# Patient Record
Sex: Male | Born: 1975 | Race: White | Hispanic: No | Marital: Married | Smoking: Never smoker
Health system: Southern US, Community
[De-identification: ages and names within clinical notes are randomized; demographics above are authoritative.]

## PROBLEM LIST (undated history)

## (undated) DIAGNOSIS — N200 Calculus of kidney: Secondary | ICD-10-CM

---

## 2011-07-02 ENCOUNTER — Ambulatory Visit (INDEPENDENT_AMBULATORY_CARE_PROVIDER_SITE_OTHER): Payer: No Typology Code available for payment source | Admitting: PREVENTIVE MEDICINE-OCCUPATIONAL MEDICINE

## 2011-07-22 NOTE — Patient Instructions (Signed)
Vaccine Information Statement    Inactivated Influenza Vaccine: What You Need to Know  2012 - 2013    Many Vaccine Information Statements are available in Spanish and other languages. See www.immunize.org/vis  Hojas de Informacin Sobre Vacunas estn disponibles en espaol y en muchos otros idiomas. Visite www.immunize.org/vis      1. Why get vaccinated?    Influenza ("flu") is a contagious disease.     It is caused by the influenza virus, which can be spread by coughing, sneezing, or nasal secretions.     Anyone can get influenza, but rates of infection are highest among children. For most people, symptoms last only a few days. They include:  . fever/chills   . sore throat . fatigue  . cough   . headache . muscle aches  . runny or stuffy nose    Other illnesses can have the same symptoms and are often mistaken for influenza.    Young children, people 65 and older, pregnant women, and people with certain health conditions - such as heart, lung or kidney disease or a weakened immune system - can get much sicker. Flu can cause high fever and pneumonia, and make existing medical conditions worse. It can cause diarrhea and seizures in children. Each year thousands of people die from influenza and even more require hospitalization.     By getting flu vaccine you can protect yourself from influenza and may also avoid spreading influenza to others.      2. Inactivated influenza vaccine    There are two types of influenza vaccine:     1. Inactivated (killed) vaccine, the "flu shot," is given by injection with a needle.     2. Live, attenuated (weakened) influenza vaccine is sprayed into the nostrils. This vaccine is described in a separate Vaccine Information Statement.     A "high-dose" inactivated influenza vaccine is available for people 65 years of age and older. Ask your doctor for more information.    Influenza viruses are always changing, so annual vaccination is recommended. Each year scientists try to match the  viruses in the vaccine to those most likely to cause flu that year.  Flu vaccine will not prevent disease from other viruses, including flu viruses not contained in the vaccine.    It takes up to 2 weeks for protection to develop after the shot. Protection lasts about a year.     Some inactivated influenza vaccine contains a preservative called thimerosal. Thimerosal-free influenza vaccine is available. Ask your doctor for more information.      3. Who should get inactivated influenza vaccine and when?    WHO  All people 6 months of age and older should get flu vaccine.    Vaccination is especially important for people at higher risk of severe influenza and their close contacts, including healthcare personnel and close contacts of children younger than 6 months.    WHEN  Get the vaccine as soon as it is available. This should provide protection if the flu season comes early. You can get the vaccine as long as illness is occurring in your community.     Influenza can occur at any time, but most influenza occurs from October through May. In recent seasons, most infections have occurred in January and February. Getting vaccinated in December, or even later, will still be beneficial in most years.    Adults and older children need one dose of influenza vaccine each year. But some children younger than 9 years of   age need two doses to be protected. Ask your doctor.    Influenza vaccine may be given at the same time as other vaccines, including pneumococcal vaccine.      4. Some people should not get inactivated influenza vaccine or should wait.    . Tell your doctor if you have any severe (life-threatening) allergies, including a severe allergy to eggs. A severe allergy to any vaccine component may be a reason not to get the vaccine. Allergic reactions to influenza vaccine are rare.      . Tell your doctor if you ever had a severe reaction after a dose of influenza vaccine.     . Tell your doctor if you ever had  Guillain-Barr Syndrome (a severe paralytic illness, also called GBS). Your doctor will help you decide whether the vaccine is recommended for you.     . People who are moderately or severely ill should usually wait until they recover before getting flu vaccine. If you are ill, talk to your doctor about whether to reschedule the vaccination. People with a mild illness can usually get the vaccine.      5. What are the risks from inactivated influenza vaccine?    A vaccine, like any medicine, could possibly cause serious problems, such as severe allergic reactions. The risk of a vaccine causing serious harm, or death, is extremely small.     Serious problems from inactivated influenza vaccine are very rare. The viruses in inactivated influenza vaccine have been killed, so you cannot get influenza from the vaccine.     Mild problems can include:   . soreness, redness, or swelling where the shot was given   . hoarseness; sore, red or itchy eyes; cough  . fever     . aches  . headache . itching . fatigue  If these problems occur, they usually begin soon after the shot and last 1-2 days.     Moderate problems:  Young children who get inactivated flu vaccine and pneumococcal vaccine (PCV13) at the same time appear to be at increased risk for seizures caused by fever. Ask your doctor for more information.    Tell your doctor if a child who is getting flu vaccine has ever had a seizure.    Severe problems:  . Life-threatening allergic reactions from vaccines are very rare. If they do occur, it is usually within a few minutes to a few hours after the shot.     . In 1976, a type of inactivated influenza (swine flu) vaccine was associated with Guillain-Barr Syndrome (GBS). Since then, flu vaccines have not been clearly linked to GBS. However, if there is a risk of GBS from current flu vaccines, it would be no more than 1 or 2 cases per million people vaccinated. This is much lower than the risk of severe influenza, which can  be prevented by vaccination.    The safety of vaccines is always being monitored.  For more information, visit:   www.cdc.gov/vaccinesafety/Vaccine_Monitoring/Index.html and   www.cdc.gov/vaccinesafety/Activities/Activities_Index.html    One brand of inactivated flu vaccine, called Afluria, should not be given to children 8 years old or younger, except in special circumstances. A related vaccine was associated with fevers and fever-related seizures in young children in [place]. Your doctor can give you more information.                   6. What if there is a severe reaction?    What should I look for?    Any unusual condition, such as a high fever or unusual behavior. Signs of a severe allergic reaction can include difficulty breathing, hoarseness or wheezing, hives, paleness, weakness, a fast heart beat or dizziness.    What should I do?  . Call a doctor, or get the person to a doctor right away.  . Tell the doctor what happened, the date and time it happened, and when the vaccination was given.  . Ask your doctor, nurse, or health department to report the reaction by filing a Vaccine Adverse Event Reporting System (VAERS) form. Or you can file this report through the VAERS website at www.vaers.hhs.gov, or by calling 1-800-822-7967.     VAERS does not provide medical advice.      7. The National Vaccine Injury Compensation Program    The National Vaccine Injury Compensation Program (VICP) was created in 1986.    People who believe they may have been injured by a vaccine can learn about the program and about filing a claim by calling 1-800-338-2382, or visiting the VICP website at ww.hrsa.gov/vaccinecompensation.      8. How can I learn more?    . Ask your doctor. They can give you the vaccine package insert or suggest other sources of information.  . Call your local or state health department.  . Contact the Centers for Disease Control and Prevention (CDC):   - Call 1-800-232-4636 (1-800-CDC-INFO) or   - Visit  CDC's website at www.cdc.gov/flu      Vaccine Information Statement (Interim)  Inactivated Influenza Vaccine (03/23/2011)       42 U.S.C. 300aa-26    Department of Health and Human Services  Centers for Disease Control and Prevention

## 2011-07-22 NOTE — Progress Notes (Signed)
Influenza vaccine given.  Merri Ray 07/22/2011, 9:51 AM

## 2012-08-04 ENCOUNTER — Ambulatory Visit (INDEPENDENT_AMBULATORY_CARE_PROVIDER_SITE_OTHER): Payer: No Typology Code available for payment source | Admitting: PREVENTIVE MEDICINE-OCCUPATIONAL MEDICINE

## 2012-08-04 NOTE — Progress Notes (Signed)
Influenza vaccine given.  Merri Ray 08/04/2012, 1:41 PM

## 2012-08-04 NOTE — Patient Instructions (Signed)
Vaccine Information Statement    Influenza (Flu) Vaccine (Inactivated): What you need to know  2013-14    Many Vaccine Information Statements are available in Spanish and other languages. See www.immunize.org/vis  Hojas de Informacián Sobre Vacunas están disponibles en Español y en muchos otros idiomas. Visite http://www.immunize.org/vis    1. Why get vaccinated?    Influenza (“flu”) is a contagious disease that spreads around the [place] every winter, usually between October and May.     Flu is caused by the influenza virus, and can be spread by coughing, sneezing, and close contact.     Anyone can get flu, but the risk of getting flu is highest among children. Symptoms come on suddenly and may last several days. They can include:  • fever/chills  • sore throat  • muscle aches  • fatigue  • cough  • headache   • runny or stuffy nose    Flu can make some people much sicker than others. These people include young children, people 65 and older, pregnant women, and people with certain health conditions - such as heart, lung or kidney disease, or a weakened immune system.  Flu vaccine is especially important for these people, and anyone in close contact with them.    Flu can also lead to pneumonia, and make existing medical conditions worse. It can cause diarrhea and seizures in children.     Each year thousands of people in the [place] die from flu, and many more are hospitalized.     Flu vaccine is the best protection we have from flu and its complications. Flu vaccine also helps prevent spreading flu from person to person.       2. Inactivated flu vaccine    There are two types of influenza vaccine:     You are getting an inactivated flu vaccine, which does not contain any live influenza virus. It is given by injection with a needle, and often called the “flu shot.”    A different, live, attenuated (weakened) influenza vaccine is sprayed into the nostrils. This vaccine is described in a separate Vaccine  Information Statement.    Flu vaccine is recommended every year. Children 6 months through 8 years of age should get two doses the first year they get vaccinated.    Flu viruses are always changing. Each year’s flu vaccine is made to protect from viruses that are most likely to cause disease that year. While flu vaccine cannot prevent all cases of flu, it is our best defense against the disease. Inactivated flu vaccine protects against 3 or 4 different influenza viruses.    It takes about 2 weeks for protection to develop after the vaccination, and protection lasts several months to a year.     Some illnesses that are not caused by influenza virus are often mistaken for flu. Flu vaccine will not prevent these illnesses. It can only prevent influenza.    A “high-dose” flu vaccine is available for people 65 years of age and older. The person giving you the vaccine can tell you more about it.    Some inactivated flu vaccine contains a very small amount of a mercury-based preservative called thimerosal. Studies have shown that thimerosal in vaccines is not harmful, but flu vaccines that do not contain a preservative are available.      3. Some people should not get this vaccine    Tell the person who gives you the vaccine:  • If you have any severe (  life-threatening) allergies.  If you ever had a life-threatening allergic reaction after a dose of flu vaccine, or have a severe allergy to any part of this vaccine, you may be advised not to get a dose.  Most, but not all, types of flu vaccine contain a small amount of egg.     • If you ever had Guillain-Barré Syndrome (a severe paralyzing illness, also called GBS). Some people with a history of GBS should not get this vaccine. This should be discussed with your doctor.  • If you are not feeling well.  They might suggest waiting until you feel better.  But you should come back.      4. Risks of a vaccine reaction    With a vaccine, like any medicine, there is a chance of  side effects. These are usually mild and go away on their own.     Serious side effects are also possible, but are very rare. Inactivated flu vaccine does not contain live flu virus, so getting flu from this vaccine is not possible.     Brief fainting spells and related symptoms (such as jerking movements) can happen after any medical procedure, including vaccination. Sitting or lying down for about 15 minutes after a vaccination can help prevent fainting and injuries caused by falls. Tell your doctor if you feel dizzy or light-headed, or have vision changes or ringing in the ears.    Mild problems following inactivated flu vaccine:   • soreness, redness, or swelling where the shot was given    • hoarseness; sore, red or itchy eyes; cough  • fever  • aches  • headache  • itching  • fatigue  If these problems occur, they usually begin soon after the shot and last 1 or 2 days.     Moderate problems following inactivated flu vaccine:  • Young children who get inactivated flu vaccine and pneumococcal vaccine (PCV13) at the same time may be at increased risk for seizures caused by fever. Ask your doctor for more information. Tell your doctor if a child who is getting flu vaccine has ever had a seizure.    Severe problems following inactivated flu vaccine:  • A severe allergic reaction could occur after any vaccine (estimated less than 1 in a million doses).   • There is a small possibility that inactivated flu vaccine could be associated with Guillain-Barré Syndrome (GBS), no more than 1 or 2 cases per million people vaccinated. This is much lower than the risk of severe complications from flu, which can be prevented by flu vaccine.     The safety of vaccines is always being monitored.  For more information, visit: www.cdc.gov/vaccinesafety/       5. What if there is a serious reaction?    What should I look for?    • Look for anything that concerns you, such as signs of a severe allergic reaction, very high fever, or  behavior changes.    Signs of a severe allergic reaction can include hives, swelling of the face and throat, difficulty breathing, a fast heartbeat, dizziness, and weakness. These would start a few minutes to a few hours after the vaccination.    What should I do?    • If you think it is a severe allergic reaction or other emergency that can’t wait, call 9-1-1 or get the person to the nearest hospital. Otherwise, call your doctor.  • Afterward, the reaction should be reported to the Vaccine Adverse Event   Reporting System (VAERS). Your doctor might file this report, or you can do it yourself through the VAERS web site at www.vaers.hhs.gov, or by calling 1-800-822-7967.    VAERS is only for reporting reactions. They do not give medical advice.      7. The National Vaccine Injury Compensation Program    The National Vaccine Injury Compensation Program (VICP) is a federal program that was created to compensate people who may have been injured by certain vaccines.    Persons who believe they may have been injured by a vaccine can learn about the program and about filing a claim by calling 1-800-338-2382 or visiting the VICP website at www.hrsa.gov/vaccinecompensation.      8. How can I learn more?  • Ask your doctor.  • Call your local or state health department.  • Contact the Centers for Disease Control and   Prevention (CDC):  - Call 1-800-232-4636 (1-800-CDC-INFO) or  - Visit CDC’s website at www.cdc.gov/flu      Vaccine Information Statement (Interim)  Inactivated Influenza Vaccine   (04/15/2012)   42 U.S.C. § 300aa-26    Department of Health and Human Services  Centers for Disease Control and Prevention    Office Use Only

## 2012-10-19 ENCOUNTER — Encounter (FREE_STANDING_LABORATORY_FACILITY): Admit: 2012-10-19 | Discharge: 2012-10-19 | Disposition: A | Discharge status: Home / Self Care | Payer: Self-pay

## 2012-10-19 LAB — COMPREHENSIVE METABOLIC PNL, FASTING
ALBUMIN: 4.3 g/dL (ref 3.5–4.8)
ALKALINE PHOSPHATASE: 48 U/L (ref 38–126)
ALT (SGPT): 33 U/L (ref 7–55)
ANION GAP: 4 mmol/L — ABNORMAL LOW (ref 5–16)
AST (SGOT): 21 U/L (ref 8–48)
BILIRUBIN, TOTAL: 0.7 mg/dL (ref 0.3–1.3)
BUN/CREAT RATIO: 11 (ref 6–22)
BUN: 10 mg/dL (ref 8–26)
CALCIUM: 9.7 mg/dL (ref 8.5–10.4)
CARBON DIOXIDE: 29 mmol/L (ref 23–33)
CHLORIDE: 106 mmol/L (ref 96–111)
CREATININE: 0.89 mg/dL (ref 0.62–1.27)
ESTIMATED GLOMERULAR FILTRATION RATE: >59 ml/min/1.73m2 (ref >59)
GLUCOSE, FASTING: 99 mg/dL (ref 70–105)
POTASSIUM: 4.9 mmol/L (ref 3.5–5.1)
SODIUM: 139 mmol/L (ref 136–145)
TOTAL PROTEIN: 7.5 g/dL (ref 6.4–8.3)

## 2012-10-19 LAB — LIPID PANEL
CHOLESTEROL: 241 mg/dL — ABNORMAL HIGH (ref <200)
HDL-CHOLESTEROL: 32 mg/dL — ABNORMAL LOW (ref >39)
LDL (CALCULATED): 160 mg/dL — ABNORMAL HIGH (ref <100)
NON - HDL (CALCULATED): 209 mg/dL — ABNORMAL HIGH (ref <190)
TRIGLYCERIDES: 244 mg/dL — ABNORMAL HIGH (ref <150)
VLDL (CALCULATED): 49 mg/dL — ABNORMAL HIGH (ref <30)

## 2012-12-12 ENCOUNTER — Encounter (INDEPENDENT_AMBULATORY_CARE_PROVIDER_SITE_OTHER): Payer: Self-pay | Admitting: Family Medicine

## 2012-12-12 ENCOUNTER — Ambulatory Visit: Payer: No Typology Code available for payment source | Attending: Family Medicine | Admitting: Family Medicine

## 2012-12-12 VITALS — BP 110/80 | HR 84 | Temp 97.4°F | Ht 68.43 in | Wt 193.1 lb

## 2012-12-12 DIAGNOSIS — Z3009 Encounter for other general counseling and advice on contraception: Secondary | ICD-10-CM | POA: Insufficient documentation

## 2012-12-12 NOTE — Progress Notes (Signed)
Subjective:     Patient ID:  Nathaniel Rowland is an 37 y.o. male   Chief Complaint:    Chief Complaint   Patient presents with   . Vasectomy Consult       HPI  He has been considering for 6 months to a year.  He has two children.  He reports wife is on board with decision.  No problems with local anesthetics.  No surgeries to scrotum.  No issues with Betadine.  No chronic medical conditions.  Non-smoker.  ROS  Objective:   Physical Exam   Constitutional: He is oriented to person, place, and time.   GU:  Vas deferens easily brought to surface bilaterally.  Neurological: He is alert and oriented to person, place, and time.   BP 110/80   Pulse 84   Temp(Src) 36.3 C (97.4 F) (Thermal Scan)   Ht 1.738 m (5' 8.43")   Wt 87.6 kg (193 lb 2 oz)   BMI 29 kg/m2   .    Assessment & Plan:       ICD-9-CM   1. Visit for vasectomy evaluation V25.09     Will schedule for vasectomy.  Went over procedure including risks/complications.  Pre-vas instructions given.    Dagoberto Ligas, DO  [facility]  Operated by [facility]  [address]  [place] [address]  Dept: [phone]  Dept Fax: [phone]

## 2012-12-13 ENCOUNTER — Encounter (INDEPENDENT_AMBULATORY_CARE_PROVIDER_SITE_OTHER): Payer: Self-pay

## 2012-12-14 ENCOUNTER — Encounter (INDEPENDENT_AMBULATORY_CARE_PROVIDER_SITE_OTHER): Payer: Self-pay

## 2013-02-10 ENCOUNTER — Ambulatory Visit
Admission: RE | Admit: 2013-02-10 | Discharge: 2013-02-10 | Disposition: A | Discharge status: Home / Self Care | Payer: No Typology Code available for payment source | Source: Ambulatory Visit | Attending: Family Medicine | Admitting: Family Medicine

## 2013-02-10 ENCOUNTER — Encounter (INDEPENDENT_AMBULATORY_CARE_PROVIDER_SITE_OTHER): Payer: Self-pay | Admitting: Family Medicine

## 2013-02-10 ENCOUNTER — Ambulatory Visit (INDEPENDENT_AMBULATORY_CARE_PROVIDER_SITE_OTHER): Payer: No Typology Code available for payment source | Admitting: Family Medicine

## 2013-02-10 ENCOUNTER — Other Ambulatory Visit (INDEPENDENT_AMBULATORY_CARE_PROVIDER_SITE_OTHER): Payer: Self-pay | Admitting: Family Medicine

## 2013-02-10 VITALS — BP 112/68 | HR 70 | Temp 97.4°F | Wt 192.7 lb

## 2013-02-10 DIAGNOSIS — Z302 Encounter for sterilization: Secondary | ICD-10-CM | POA: Insufficient documentation

## 2013-02-10 HISTORY — PX: HX VASECTOMY: SHX75

## 2013-02-10 NOTE — Procedures (Signed)
 Procedure:  Vasectomy  Indication:  Undesired fertility  Surgeons:  Dr. Selinda Rowland, Dr. Lupita Cyphers  Assistant: Jinnie Motts, MA      TIME OUT: A time out was performed at 1300 to confirm the correct patient, procedure, and site.Nathaniel Selinda CHRISTELLA Bad, DO        The patient was properly identified and the proper procedure confirmed.  This is a bilateral procedure.    Procedure:  After discussing the risks including regret, bleeding, infection, need for emergent intervention, lack of success of procedure, spermatoceles, and chronic testicular pain with Nathaniel Rowland, he wished to proceed.  The patient was placed in the supine position on the automated table.  The scrotum and regional area were prepped with Betadine and draped sterilely.      The left vas deferens was palpated and pushed directly under the scrotal skin, between the thumb and second and third digits.  2% Lidocaine without epinephrine was infiltrated subcutaneously.  A 15-blade scalpel was used to sharply incise the scrotal skin overlying the vas.  The vas dissecting forceps were used to bluntly separate connective tissue on either side of the vas.  The vas clamp was used to grasp the vas and bring it through the incision.  A second application of Bupivicaine was administered along the proximal and distal vas regions.      The vas dissecting forceps were used to dissect connective tissue overlying the vas.  Thermal cautery was used to dissect remaining connective tissue around the vas leaving a clean 2+ cm length of vas deferens.  The vas was hemitransected so electrocautery could be applied to both the proximal and distal stumps.  The intervening section was excised and placed into a specimen cup labeled left vas segment.  Hemostasis was ensured.  The scrotal skin was lifted up causing the return of internal tissues into the scrotum.  The scrotal incision was closed with clamp for 15 minutes.  Dressing with bacitracin applied.    Attention was  turned to the right side where a similar procedure was performed.  2% Lidocaine without epinephrine was infiltrated subcutaneously.  A 15-blade scalpel was used to sharply dissect the scrotal skin.  The vas dissecting clamps were used to bluntly dissect connective tissue on either side of the vas deferens.  The vas was grasped with the vas clamps and brought out of the incision.  A second administration of Bupivicaine was provided along the proximal and distal vas regions.  Vas dissecting forceps were used to dissect surrounding connective tissue.  Thermal cautery was used to dissect remaining connective around the vas leaving a clean 2+ cm segment of vas deferens.  The vas was hemitransected so electrocautery could be applied to both the proximal and distal stumps.  The intervening segment of vas was resected and placed in the specimen jar labeled right vas.  Hemostasis was ensured.  The scrotal skin was lifted up causing the return of internal tissues into the scrotum.  The scrotal incision was closed with clamp for 15 minutes.  Dressing with bacitracin applied.  The patient tolerated the procedure well with minimal discomfort.      Post-vasectomy instructions were reviewed including the notice of a lack of contraceptive effect until 6 weeks, 20 ejaculations, AND a semen analysis consistent with infertility.  He is to ice the area hourly this evening and tomorrow and participate in minimal if any movement over the weekend.  He will follow-up in three days on Monday for reexamination  and is to call sooner for any concerns.        Selinda CHRISTELLA Bad, DO 02/10/2013, 2:14 PM

## 2013-02-14 ENCOUNTER — Encounter (INDEPENDENT_AMBULATORY_CARE_PROVIDER_SITE_OTHER): Payer: Self-pay | Admitting: Family Medicine

## 2013-02-14 ENCOUNTER — Ambulatory Visit: Payer: No Typology Code available for payment source | Attending: Family Medicine | Admitting: Family Medicine

## 2013-02-14 VITALS — BP 112/78 | HR 68 | Temp 96.8°F | Ht 68.43 in | Wt 197.1 lb

## 2013-02-14 DIAGNOSIS — Z9852 Vasectomy status: Secondary | ICD-10-CM | POA: Insufficient documentation

## 2013-02-15 LAB — HISTORICAL SURGICAL PATHOLOGY SPECIMEN

## 2013-02-23 NOTE — Progress Notes (Signed)
S: Here for vasectomy follow-up.  He did well since the procedure with expected discomfort levels.      O:   There are well healing wounds on the scrotum with no signs of infection.  Less than expected amount of swelling and bruising present.    A/P:  Encounter Diagnoses   Code Name Primary? Qualifier   • V26.52B S/P vasectomy Yes        He was reminded of the use of secondary contraception until semen analysis completed.  He will be notified of results when done.    Kaitlynne Wenz M Hibah Odonnell, DO

## 2013-07-15 ENCOUNTER — Other Ambulatory Visit: Payer: Self-pay

## 2013-07-17 ENCOUNTER — Other Ambulatory Visit: Payer: Self-pay

## 2013-07-25 ENCOUNTER — Encounter (FREE_STANDING_LABORATORY_FACILITY): Admit: 2013-07-25 | Discharge: 2013-07-25 | Disposition: A | Discharge status: Home / Self Care | Payer: Self-pay

## 2013-07-25 ENCOUNTER — Ambulatory Visit (INDEPENDENT_AMBULATORY_CARE_PROVIDER_SITE_OTHER): Payer: Self-pay

## 2013-07-25 VITALS — BP 126/84 | HR 73 | Temp 96.3°F | Resp 16 | Ht 68.5 in | Wt 207.0 lb

## 2013-07-25 LAB — COMPREHENSIVE METABOLIC PNL, FASTING
ALBUMIN: 4.2 g/dL (ref 3.5–5.0)
ALKALINE PHOSPHATASE: 61 U/L (ref <150)
ALT (SGPT): 62 U/L — ABNORMAL HIGH (ref <55)
ANION GAP: 7 mmol/L (ref 4–13)
AST (SGOT): 31 U/L (ref 8–45)
BILIRUBIN, TOTAL: 1.1 mg/dL (ref 0.3–1.3)
BUN/CREAT RATIO: 11 (ref 6–22)
BUN: 10 mg/dL (ref 8–25)
CALCIUM: 9.7 mg/dL (ref 8.5–10.4)
CARBON DIOXIDE: 26 mmol/L (ref 22–32)
CHLORIDE: 106 mmol/L (ref 96–111)
CREATININE: 0.93 mg/dL (ref 0.62–1.27)
ESTIMATED GLOMERULAR FILTRATION RATE: >59 ml/min/1.73m2 (ref >59)
GLUCOSE, FASTING: 97 mg/dL (ref 70–105)
POTASSIUM: 4.6 mmol/L (ref 3.5–5.1)
SODIUM: 139 mmol/L (ref 136–145)
TOTAL PROTEIN: 7.6 g/dL (ref 6.4–8.3)

## 2013-07-25 LAB — LIPID PANEL
CHOLESTEROL: 258 mg/dL — ABNORMAL HIGH (ref <200)
HDL-CHOLESTEROL: 31 mg/dL — ABNORMAL LOW (ref >39)
LDL (CALCULATED): 172 mg/dL — ABNORMAL HIGH (ref <100)
NON - HDL (CALCULATED): 227 mg/dL — ABNORMAL HIGH (ref <190)
TRIGLYCERIDES: 275 mg/dL — ABNORMAL HIGH (ref <150)
VLDL (CALCULATED): 55 mg/dL — ABNORMAL HIGH (ref <30)

## 2013-07-25 LAB — CBC/DIFF
BASOPHILS: 1 %
BASOS ABS: 0.029 THOU/uL (ref 0.0–0.2)
EOS ABS: 0.092 10*3/uL (ref 0.0–0.5)
EOSINOPHIL: 2 %
HCT: 43.1 % (ref 36.7–47.0)
HGB: 15 g/dL (ref 12.5–16.3)
LYMPHOCYTES: 37 %
LYMPHS ABS: 2.274 THOU/uL (ref 1.0–4.8)
MCH: 30.7 pg (ref 27.4–33.0)
MCHC: 34.8 g/dL (ref 32.5–35.8)
MCV: 88.2 fL (ref 78–100)
MONOCYTES: 8 %
MONOS ABS: 0.493 THOU/uL (ref 0.3–1.0)
MPV: 8.4 fL (ref 7.5–11.5)
PLATELET COUNT: 221 10*3/uL (ref 140–450)
PMN ABS: 3.187 THOU/uL (ref 1.5–7.7)
PMN'S: 52 %
RBC: 4.89 MIL/uL (ref 4.06–5.63)
RDW: 13.1 % (ref 12.0–15.0)
WBC: 6.1 THOU/uL (ref 3.5–11.0)

## 2013-07-25 LAB — THYROID STIMULATING HORMONE (SENSITIVE TSH): TSH: 2.594 u[IU]/mL (ref 0.350–5.000)

## 2013-07-25 NOTE — Progress Notes (Signed)
 Patient here for work-related exam.  See documentation in paper chart.    Charlies Silva, RN 07/25/2013, 11:19 AM

## 2013-07-26 LAB — HGA1C (HEMOGLOBIN A1C WITH EST AVG GLUCOSE)
ESTIMATED AVERAGE GLUCOSE: 85 mg/dL
HEMOGLOBIN A1C: 4.6 % (ref 4.0–6.0)

## 2013-09-19 ENCOUNTER — Ambulatory Visit (INDEPENDENT_AMBULATORY_CARE_PROVIDER_SITE_OTHER): Payer: No Typology Code available for payment source

## 2013-09-19 ENCOUNTER — Encounter (INDEPENDENT_AMBULATORY_CARE_PROVIDER_SITE_OTHER): Payer: Self-pay | Admitting: Family Medicine

## 2013-09-19 NOTE — Progress Notes (Signed)
 [facility]  Andrology Lab  [address], [address]  [place], [place] [address]    Andrea Bury, PhD, HCLD  Nancyann EMERSON Crews, [place], [place]   Phone: [phone]  FAX: [phone]      Ozell MICAEL Misty, PhD, [facility]       Semen Analysis    Pt Name Nathaniel Rowland  Procedure: Semen Analysis- Post Vasectomy     Wife's Name [name]  Date: 09/19/13   Physician Laureen  Days of Abstinence: 1   Time of Ejaculate 1330  Method (1) Cup   Time Sample Received 1355  Method (2) Masturbation   Time of Analysis 1402  Technologist DZ   Patient Lab number [id number]  Any Sample lost No     Characteristics of Semen Sample  Characteristic  Normal  Sperm Count  Normal   Volume in ml 3.5 1.5 to 5ml  Liquefaction yes Yes   Color Clear gray White to [employer]  Gel no No   PH Value 7.9 7.5 to 8.5  Total Sperm (Mil/ml) 0 >15  million/ml   Round Cells 0 < 5/hpf  Active Sperm (Mil/ml) 0 > 10 million/ml   Viscosity 1 < 2  % Motile  >40%   Debris High Low  Grade  3 to 4   Agglutination no No   Technologist DZ   Crystals no No         Sperm Morphology  (Based upon the [facility] 2011 Standard)    Normal    Normal   WHO Normal Forms % > 11%  Twin Tail % < 4%   Abnormal Head % < 70%  Cytoplasmic Droplet  < 4%   Tapered Head % < 4%  Other % < 4%   Coiled Tail % < 4%  Technologist       Strict Sperm Morphology (Based upon the [facility] Strict Morphology Standard)  Normal Forms  % Normal > 10%  Marginal 5-9%  Abnormal Forms % Normal < 90%  Marginal 91-95% Technologist       Comments  Liquifaction  WBC Stain    Morphology  Other      Andrologist's Comment: No sperm seen after centrifugation  Report Date: 09/19/2013     Electronically signed by Todd Crews, Andrologist

## 2014-06-28 ENCOUNTER — Other Ambulatory Visit: Payer: Self-pay

## 2014-07-18 ENCOUNTER — Ambulatory Visit (INDEPENDENT_AMBULATORY_CARE_PROVIDER_SITE_OTHER): Payer: No Typology Code available for payment source

## 2014-07-18 VITALS — Temp 98.6°F

## 2014-07-18 DIAGNOSIS — Z23 Encounter for immunization: Principal | ICD-10-CM

## 2014-07-18 NOTE — Patient Instructions (Signed)
2015-2016 Vaccine Information Statement    Influenza (Flu) Vaccine (Inactivated or Recombinant): What you need to know    Many Vaccine Information Statements are available in Spanish and other languages. See www.immunize.org/vis  Hojas de Información Sobre Vacunas están disponibles en Español y en muchos otros idiomas. Visite www.immunize.org/vis    1. Why get vaccinated?    Influenza ("flu") is a contagious disease that spreads around the [place] every year, usually between October and May.     Flu is caused by influenza viruses, and is spread mainly by coughing, sneezing, and close contact.     Anyone can get flu. Flu strikes suddenly and can last several days. Symptoms vary by age, but can include:  o fever/chills  o sore throat  o muscle aches  o fatigue  o cough  o headache   o runny or stuffy nose    Flu can also lead to pneumonia and blood infections, and cause diarrhea and seizures in children.  If you have a medical condition, such as heart or lung disease, flu can make it worse.    Flu is more dangerous for some people. Infants and young children, people 65 years of age and older, pregnant women, and people with certain health conditions or a weakened immune system are at greatest risk.      Each year thousands of people in the [place] die from flu, and many more are hospitalized.     Flu vaccine can:  o keep you from getting flu,  o make flu less severe if you do get it, and  o keep you from spreading flu to your family and other people.     2. Inactivated and recombinant flu vaccines    A dose of flu vaccine is recommended every flu season. Children 6 months through 8 years of age may need two doses during the same flu season.  Everyone else needs only one dose each flu season.       Some inactivated flu vaccines contain a very small amount of a mercury-based preservative called thimerosal. Studies have not shown thimerosal in vaccines to be harmful, but flu vaccines that do not contain  thimerosal are available.    There is no live flu virus in flu shots.  They cannot cause the flu.     There are many flu viruses, and they are always changing. Each year a new flu vaccine is made to protect against three or four viruses that are likely to cause disease in the upcoming flu season. But even when the vaccine doesn't exactly match these viruses, it may still provide some protection    Flu vaccine cannot prevent:  o flu that is caused by a virus not covered by the vaccine, or  o illnesses that look like flu but are not.    It takes about 2 weeks for protection to develop after vaccination, and protection lasts through the flu season.     3. Some people should not get this vaccine    Tell the person who is giving you the vaccine:    o If you have any severe, life-threatening allergies.    If you ever had a life-threatening allergic reaction after a dose of flu vaccine, or have a severe allergy to any part of this vaccine, you may be advised not to get vaccinated.  Most, but not all, types of flu vaccine contain a small amount of egg protein.       o If   you ever had Guillain-Barré Syndrome (also called GBS).   Some people with a history of GBS should not get this vaccine. This should be discussed with your doctor.    o If you are not feeling well.    It is usually okay to get flu vaccine when you have a mild illness, but you might be asked to come back when you feel better.      4. Risks of a vaccine reaction     With any medicine, including vaccines, there is a chance of reactions. These are usually mild and go away on their own, but serious reactions are also possible.     Most people who get a flu shot do not have any problems with it.     Minor problems following a flu shot include:   o soreness, redness, or swelling where the shot was given    o hoarseness  o sore, red or itchy eyes  o cough  o fever  o aches  o headache  o itching  o fatigue  If these problems occur, they usually begin soon after the  shot and last 1 or 2 days.     More serious problems following a flu shot can include the following:    o There may be a small increased risk of Guillain-Barré Syndrome (GBS) after inactivated flu vaccine.  This risk has been estimated at 1 or 2 additional cases per million people vaccinated. This is much lower than the risk of severe complications from flu, which can be prevented by flu vaccine.      o Young children who get the flu shot along with pneumococcal vaccine (PCV13) and/or DTaP vaccine at the same time might be slightly more likely to have a seizure caused by fever. Ask your doctor for more information. Tell your doctor if a child who is getting flu vaccine has ever had a seizure.     Problems that could happen after any injected vaccine:     o People sometimes faint after a medical procedure, including vaccination. Sitting or lying down for about 15 minutes can help prevent fainting, and injuries caused by a fall. Tell your doctor if you feel dizzy, or have vision changes or ringing in the ears.    o Some people get severe pain in the shoulder and have difficulty moving the arm where a shot was given. This happens very rarely.    o Any medication can cause a severe allergic reaction. Such reactions from a vaccine are very rare, estimated at about 1 in a million doses, and would happen within a few minutes to a few hours after the vaccination.    As with any medicine, there is a very remote chance of a vaccine causing a serious injury or death.    The safety of vaccines is always being monitored. For more information, visit: www.cdc.gov/vaccinesafety/    5. What if there is a serious reaction?    What should I look for?    o Look for anything that concerns you, such as signs of a severe allergic reaction, very high fever, or unusual behavior.    Signs of a severe allergic reaction can include hives, swelling of the face and throat, difficulty breathing, a fast heartbeat, dizziness, and weakness - usually  within a few minutes to a few hours after the vaccination.    What should I do?    o If you think it is a severe allergic reaction or other   emergency that can't wait, call 9-1-1 and get the person to the nearest hospital. Otherwise, call your doctor.    o Reactions should be reported to the Vaccine Adverse Event Reporting System (VAERS). Your doctor should file this report, or you can do it yourself through  the VAERS web site at www.vaers.hhs.gov, or by calling 1-800-822-7967.    VAERS does not give medical advice.    6. The National Vaccine Injury Compensation Program    The National Vaccine Injury Compensation Program (VICP) is a federal program that was created to compensate people who may have been injured by certain vaccines.    Persons who believe they may have been injured by a vaccine can learn about the program and about filing a claim by calling 1-800-338-2382 or visiting the VICP website at www.hrsa.gov/vaccinecompensation.  There is a time limit to file a claim for compensation.    7. How can I learn more?  o Ask your healthcare provider. He or she can give you the vaccine package insert or suggest other sources of information.  o Call your local or state health department.  o Contact the Centers for Disease Control and Prevention (CDC):  - Call 1-800-232-4636 (1-800-CDC-INFO) or  - Visit CDC's website at www.cdc.gov/flu    Vaccine Information Statement   Inactivated Influenza Vaccine   04/27/2014  42 U.S.C. § 300aa-26    Department of Health and Human Services  Centers for Disease Control and Prevention    Office Use Only

## 2014-07-18 NOTE — Progress Notes (Signed)
1. Are you 38 years of age or older? yes  3. Have you ever had a severe reaction to a flu shot? no  4. Are you allergic to eggs? no  5. Are you allergic to latex? no  6. Are you allergic to Thimerosol? no  7. Are you experiencing acute illness symptoms or have you been running a fever? no  8. Do you have a medical condition or taking medications that suppress your immune system? no  9. Have you ever had Guillain-Barre syndrome or other neurologic disorder? no  10. Are you pregnant or breastfeeding? no    Immunization administered     Name Date Dose VIS Date Route    INFLUENZA VACCINE IM  Incomplete 0.5 mL  Intramuscular        [facility]    Macon LargeKelly J Ewelina Naves 07/18/2014, 13:56

## 2015-01-30 ENCOUNTER — Encounter (FREE_STANDING_LABORATORY_FACILITY)
Admit: 2015-01-30 | Discharge: 2015-01-30 | Disposition: A | Discharge status: Home / Self Care | Payer: No Typology Code available for payment source | Attending: Emergency Medicine | Admitting: Emergency Medicine

## 2015-01-30 ENCOUNTER — Ambulatory Visit (INDEPENDENT_AMBULATORY_CARE_PROVIDER_SITE_OTHER): Payer: No Typology Code available for payment source

## 2015-01-30 VITALS — BP 120/76

## 2015-01-30 DIAGNOSIS — Z13220 Encounter for screening for lipoid disorders: Secondary | ICD-10-CM

## 2015-01-30 DIAGNOSIS — IMO0001 Reserved for inherently not codable concepts without codable children: Secondary | ICD-10-CM

## 2015-01-30 DIAGNOSIS — Z136 Encounter for screening for cardiovascular disorders: Secondary | ICD-10-CM | POA: Insufficient documentation

## 2015-01-30 NOTE — Progress Notes (Signed)
1 green top drawn from [facility] with 1 successful attempt. Pt tolerated.

## 2015-01-30 NOTE — Patient Instructions (Signed)
Cholesterol  Cholesterol is a fat. Your body needs a small amount of cholesterol. Cholesterol may build up in your blood vessels. This increases your chance of having a heart attack or stroke.  You cannot feel your cholesterol levels. The only way to know your cholesterol level is high is with a blood test. Keep your test results. Work with your doctor to keep your cholesterol at a good level.  WHAT DO THE TEST RESULTS MEAN?  · Total cholesterol is how much cholesterol is in your blood.  · LDL is bad cholesterol. This is the type that can build up. You want LDL to be low.  · HDL is good cholesterol. It cleans your blood vessels and carries LDL away. You want HDL to be high.  · Triglycerides are fat that the body can burn for energy or store.  WHAT ARE GOOD LEVELS OF CHOLESTEROL?  · Total cholesterol below 200.  · LDL below 100 for people at risk. Below 70 for those at very high risk.  · HDL above 50 is good. Above 60 is best.  · Triglycerides below 150.  HOW CAN I LOWER MY CHOLESTEROL?  · Diet. Follow your diet programs as told by your doctor.    Choose fish, white meat chicken, roasted turkey, or baked turkey. Try not to eat red meat, fried foods, or processed meats such as sausage and lunch meats.    Eat lots of fresh fruits and vegetables.    Choose whole grains, beans, pasta, potatoes, and cereals.    Use only small amounts of olive, corn, or canola oils.    Try not to eat butter, mayonnaise, shortening, or palm kernel oils.    Try not to eat foods with trans fats.    Drink skim or nonfat milk. Eat low-fat or nonfat yogurt and cheeses. Try not to drink whole milk or cream. Try not to eat ice cream, egg yolks, and full-fat cheeses.    Healthy desserts include angel food cake, ginger snaps, animal crackers, hard candy, popsicles, and low-fat or nonfat frozen yogurt. Try not to eat pastries, cakes, pies, and cookies.  · Exercise. Follow your exercise programs as told by your doctor.    Be more active. You can try  gardening, walking, or taking the stairs. Ask your doctor about how you can be more active.  · Medicine. Take medicine as told by your doctor.     This information is not intended to replace advice given to you by your health care provider. Make sure you discuss any questions you have with your health care provider.     Document Released: 12/04/2008 Document Revised: 09/28/2014 Document Reviewed: 06/21/2013  ExitCare® Patient Information ©2016 [employer].

## 2015-01-31 LAB — LIPID PANEL
CHOLESTEROL: 246 mg/dL — ABNORMAL HIGH (ref <200)
CHOLESTEROL: 246 mg/dL — ABNORMAL HIGH (ref <200)
HDL-CHOLESTEROL: 33 mg/dL — ABNORMAL LOW (ref >39)
LDL (CALCULATED): 166 mg/dL — ABNORMAL HIGH (ref <100)
NON - HDL (CALCULATED): 213 mg/dL — ABNORMAL HIGH (ref <190)
TRIGLYCERIDES: 233 mg/dL — ABNORMAL HIGH (ref <150)
VLDL (CALCULATED): 47 mg/dL — ABNORMAL HIGH (ref <30)

## 2015-01-31 LAB — GLUCOSE FASTING: GLUCOSE, FASTING: 76 mg/dL (ref 70–105)

## 2015-04-29 ENCOUNTER — Encounter (INDEPENDENT_AMBULATORY_CARE_PROVIDER_SITE_OTHER): Payer: Self-pay

## 2015-04-29 ENCOUNTER — Ambulatory Visit (INDEPENDENT_AMBULATORY_CARE_PROVIDER_SITE_OTHER): Payer: No Typology Code available for payment source

## 2015-04-29 VITALS — BP 145/91 | HR 67 | Temp 98.4°F | Resp 16 | Ht 69.0 in | Wt 209.2 lb

## 2015-04-29 DIAGNOSIS — L259 Unspecified contact dermatitis, unspecified cause: Secondary | ICD-10-CM

## 2015-04-29 MED ORDER — BETAMETHASONE DIPROPIONATE 0.05 % TOPICAL CREAM
TOPICAL_CREAM | Freq: Two times a day (BID) | CUTANEOUS | Status: AC
Start: 2015-04-29 — End: 2015-05-09

## 2015-04-29 NOTE — Patient Instructions (Addendum)
[facility]      Operated by [facility]  [address] [place], [place] [address]  Phone: [phone])  Fax: [phone]  [email or web address]  Open Daily 8:00a - 8:00p    Closed Thanksgiving and Christmas Day  [facility]     Operated by [facility]  [address]  [facility]  [place], [place] [address]  Phone: [phone])  Fax: [phone]  [email or web address]  Open M-F  8:00a - 8:00p  Sat              10:00a - 4:00p  Sun             Closed  Closed all [place] Holidays        Attending Caregiver: Roselind Messier, PA-C      Today's orders:   Orders Placed This Encounter    Betamethasone Dipropionate (MAXIVATE) 0.05 % Cream        Prescription(s) E-Rx to:  [id number] - [place], [place] - [address]    ________________________________________________________________________  Short Term Disability and Family Medical Leave Act  [facility] does NOT provide assistance with any disability applications.  If you feel your medical condition requires you to be on disability, you will need to follow up with  your primary care physician or a specialist.  We apologize for any inconvenience.    For Medication Prescribed by [facility]:  As an Urgent Care facility, our clinic does NOT offer prescription refills over the telephone.    If you need more of the medication one of our medical providers prescribed, you will  either need to be re-evaluated by [place] or see your primary care physician.    ________________________________________________________________________      It is very important that we have a phone number.  This is the single best way to contact you in the event that we become aware of important clinical information or concerns after your discharge.  If the phone number you provided at registration is NOT this number you should inform staff and registration prior to  leaving.      Your treatment and evaluation today was focused on identifying and treating potentially emergent conditions based on your presenting signs, symptoms, and history.  The resulting initial clinical impression and treatment plan is not intended to be definitive or a substitute for a full physical examination and evaluation by your primary care provider.  If your symptoms persist, worsen, or you develop any new or concerning symptoms, you need to be evaluated.      If you received x-rays during your visit, be aware that the final and formal interpretation of those films by a radiologist may occur after your discharge.  If there is a significant discrepancy identified after your discharge, we will contact you at the telephone number provided at registration.      If you received a pelvic exam, you may have cultures pending for sexually transmitted diseases.  Positive cultures are reported to the [facility] Department of Health as required by state law.  You may contact the Health Information Management Office of [facility] to get a copy of your results.     If you are over 29 year old, we cannot discuss your personal health information with a parent, spouse, family member, or anyone else without your consent.  This does not include those who have legitimate access to your records and information to assist  in your care under the provisions of HIPAA [facility] Portability and Accountability Act) law, or those to whom you have previously given written consent to do so, such a legal guardian or Power of [place].      Instructions are discussed with patient upon discharge by clinical staff with all questions answered.  Please call [facility] [phone]) if any further questions develop.  Go immediately to the emergency department if any concerns or worsening symptoms.      Evlyn Courier [place], PA-C 04/29/2015, 17:44      Poison Ivy  Poison ivy is a inflammation of the skin (contact dermatitis)  caused by touching the allergens on the leaves of the ivy plant following previous exposure to the plant. The rash usually appears 48 hours after exposure. The rash is usually bumps (papules) or blisters (vesicles) in a linear pattern. Depending on your own sensitivity, the rash may simply cause redness and itching, or it may also progress to blisters which may break open. These must be well cared for to prevent secondary bacterial (germ) infection, followed by scarring. Keep any open areas dry, clean, dressed, and covered with an antibacterial ointment if needed. The eyes may also get puffy. The puffiness is worst in the morning and gets better as the day progresses. This dermatitis usually heals without scarring, within 2 to 3 weeks without treatment.  HOME CARE INSTRUCTIONS   Thoroughly wash with soap and water as soon as you have been exposed to poison ivy. You have about one half hour to remove the plant resin before it will cause the rash. This washing will destroy the oil or antigen on the skin that is causing, or will cause, the rash. Be sure to wash under your fingernails as any plant resin there will continue to spread the rash. Do not rub skin vigorously when washing affected area. Poison ivy cannot spread if no oil from the plant remains on your body. A rash that has progressed to weeping sores will not spread the rash unless you have not washed thoroughly. It is also important to wash any clothes you have been wearing as these may carry active allergens. The rash will return if you wear the unwashed clothing, even several days later.  Avoidance of the plant in the future is the best measure. Poison ivy plant can be recognized by the number of leaves. Generally, poison ivy has three leaves with flowering branches on a single stem.  Diphenhydramine may be purchased over the counter and used as needed for itching. Do not drive with this medication if it makes you drowsy.Ask your caregiver about medication  for children.  SEEK MEDICAL CARE IF:   Open sores develop.   Redness spreads beyond area of rash.   You notice purulent (pus-like) discharge.   You have increased pain.   Other signs of infection develop (such as fever).     This information is not intended to replace advice given to you by your health care provider. Make sure you discuss any questions you have with your health care provider.     Document Released: 09/04/2000 Document Revised: 11/30/2011 Document Reviewed: 02/15/2009  Elsevier Interactive Patient Education 2016 Elsevier Inc.  Betamethasone skin cream, gel, lotion, or ointment  What is this medicine?  BETAMETHASONE (bay ta METH a sone) is a corticosteroid. It is used on the skin to treat swelling, redness, itching, and allergic reactions.  This medicine may be used for other purposes; ask your health care provider  or pharmacist if you have questions.  What should I tell my health care provider before I take this medicine?  They need to know if you have any of these conditions:  -acne or rosacea  -any type of active infection  -diabetes  -glaucoma or cataracts  -large areas of burned or damaged skin  -skin wasting or thinning  -an unusual or allergic reaction to betamethasone, corticosteroids, other medicines, foods, dyes, or preservatives  -pregnant or trying to get pregnant  -breast-feeding  How should I use this medicine?  This medicine is for external use only. Do not take by mouth. Follow the directions on the prescription label. Wash your hands before and after use. Apply a thin film of medicine to the affected area. Do not cover with a bandage or dressing unless your doctor or health care professional tells you to. Do not use on healthy skin or over large areas of skin. Do not get this medicine in your eyes. If you do, rinse out with plenty of cool tap water. It is important not to use more medicine than prescribed. Do not use your medicine more often than directed. Do not use for more  than 14 days.  Talk to your pediatrician regarding the use of this medicine in children. Special care may be needed. While this drug may be prescribed for children as young as 47 years of age for selected conditions, precautions do apply. If applying this medicine to the diaper area of a child, do not cover with tight-fitting diapers or plastic pants. This may increase the amount of medicine that passes through the skin and increase the risk of serious side effects.  Elderly patients are more likely to have damaged skin through aging, and this may increase side effects. This medicine should only be used for brief periods and infrequently in older patients.  Overdosage: If you think you have taken too much of this medicine contact a poison control center or emergency room at once.  NOTE: This medicine is only for you. Do not share this medicine with others.  What if I miss a dose?  If you miss a dose, use it as soon as you can. If it is almost time for your next dose, use only that dose. Do not use double or extra doses.  What may interact with this medicine?  Interactions are not expected. Do not use any other skin products without telling your doctor or health care professional.  This list may not describe all possible interactions. Give your health care provider a list of all the medicines, herbs, non-prescription drugs, or dietary supplements you use. Also tell them if you smoke, drink alcohol, or use illegal drugs. Some items may interact with your medicine.  What should I watch for while using this medicine?  Tell your doctor or health care professional if your symptoms do not improve within one week. Tell your doctor or health care professional if you are exposed to anyone with measles or chickenpox, or if you develop sores or blisters that do not heal properly.  What side effects may I notice from receiving this medicine?  Side effects that you should report to your doctor or health care professional as soon  as possible:  -allergic reactions like skin rash, itching or hives, swelling of the face, lips, or tongue  -burning or itching of the skin that does not go away  -dark red spots on the skin  -infection  -lack of healing of skin condition  -  painful, red, pus filled blisters in hair follicles  -thinning of the skin, with easy bruising, sunburn more likely especially on the face  Side effects that usually do not require medical attention (report to your doctor or health care professional if they continue or are bothersome):  -dry skin  -increased redness or scaling of the skin  -mild burning, itching, or irritation of the skin  This list may not describe all possible side effects. Call your doctor for medical advice about side effects. You may report side effects to FDA at 1-800-FDA-1088.  Where should I keep my medicine?  Keep out of the reach of children.  Store at room temperature between 2 and 30 degrees C (36 and 86 degrees F). Do not freeze. Throw away any unused medicine after the expiration date.  NOTE: This sheet is a summary. It may not cover all possible information. If you have questions about this medicine, talk to your doctor, pharmacist, or health care provider.      2016, Elsevier/Gold Standard. (2007-12-07 16:18:56)

## 2015-04-29 NOTE — Progress Notes (Signed)
Attending physician: Judie Petit rogers       History of Present Illness: Nathaniel Rowland is a 39 y.o. male who presents to the Urgent Care today with chief complaint of    Chief Complaint     Poison Ivy/Poison Oak/Poison Sumac Exposure x 10 days    Itching     Rash           .     Onset: 10 days ago   Location: arms and legs   Severity: driving me nuts   Quality: pruritic   Timing: constant   Context: remembers being in the weeds   Modifying factors: OTC meds bring temporary relief from itching   Associated symptoms: + itching, no  burning, no  pain preceding rash, no redness/warmth, no  drainage    Review of Systems:    General: no fever  ENT:  no sore throat  Genitourinary:  no dysuria  Skin:  rash  All other review of systems were negative      I reviewed and confirmed the patient's past medical history taken by the nurse or medical assistant with the addition of the following:    Past Medical History:    History reviewed. No pertinent past medical history.  Past Medical History was reviewed and is negative for significant hx    Past Surgical History:    Past Surgical History   Procedure Laterality Date    Hx vasectomy  02-10-13     Dr. Alphonsus Sias         Allergies:  No Known Allergies  Medications:    Current Outpatient Prescriptions   Medication Sig    Betamethasone Dipropionate (MAXIVATE) 0.05 % Cream Apply topically Twice daily for 10 days Avoid face, axilla or groin.    buPROPion (WELLBUTRIN XL) 300 mg Oral Tablet Sustained Release 24 hr Take 300 mg by mouth Every morning    sertraline (ZOLOFT) 50 mg Oral Tablet Take 50 mg by mouth Once a day     Social History:    History   Substance Use Topics    Smoking status: Never Smoker     Smokeless tobacco: Never Used      Comment: Height: 173.8cm on 12-12-12 JNO    Alcohol Use: Yes     Family History: No significant family history.  Family History   Problem Relation Age of Onset    No Known Problems Mother     No Known Problems Father              Physical  Exam:  Vital signs:   Filed Vitals:    04/29/15 1731   BP: 145/91   Pulse: 67   Temp: 36.9 C (98.4 F)   TempSrc: Tympanic   Resp: 16   Height: 1.753 m ( )   Weight: 94.9 kg (209 lb 3.5 oz)   SpO2: 97%       General:  Well appearing and No acute distress  Pulmonary:  clear to auscultation bilaterally and no wheezes  Cardiovascular:  regular rate/rhythm and normal S1/S2  Skin:  scattered patchy erythematous papular rash with crusting in some areas      Data Reviewed:    Not applicable    Course: Condition at discharge: Good     Differential Diagnosis: contact derm vs other rash     Assessment:   1. Contact dermatitis        Plan:    Orders Placed This Encounter    Betamethasone Dipropionate (MAXIVATE) 0.05 %  Cream          Go to Emergency Department immediately for further work up if any concerning symptoms.  Plan was discussed and patient verbalized understanding.  If symptoms are worsening or not improving the patient should return to the Urgent Care for further evaluation.    The co-signing faculty was physically present in Urgent Care and available for consultation and did not particpate in the care of this patient.      Evlyn Courier Bald[place], PA-C 04/29/2015, 19:09

## 2015-06-26 ENCOUNTER — Ambulatory Visit (INDEPENDENT_AMBULATORY_CARE_PROVIDER_SITE_OTHER): Payer: No Typology Code available for payment source

## 2015-06-26 DIAGNOSIS — Z23 Encounter for immunization: Principal | ICD-10-CM

## 2015-06-26 NOTE — Patient Instructions (Signed)
2016-2017 Vaccine Information Statement    Influenza (Flu) Vaccine (Inactivated or Recombinant): What you need to know    Many Vaccine Information Statements are available in Spanish and other languages. See www.immunize.org/vis  Hojas de Información Sobre Vacunas están disponibles en Español y en muchos otros idiomas. Visite www.immunize.org/vis    1. Why get vaccinated?    Influenza ("flu") is a contagious disease that spreads around the [place] every year, usually between October and May.     Flu is caused by influenza viruses, and is spread mainly by coughing, sneezing, and close contact.     Anyone can get flu. Flu strikes suddenly and can last several days. Symptoms vary by age, but can include:  o fever/chills  o sore throat  o muscle aches  o fatigue  o cough  o headache   o runny or stuffy nose    Flu can also lead to pneumonia and blood infections, and cause diarrhea and seizures in children.  If you have a medical condition, such as heart or lung disease, flu can make it worse.    Flu is more dangerous for some people. Infants and young children, people 65 years of age and older, pregnant women, and people with certain health conditions or a weakened immune system are at greatest risk.      Each year thousands of people in the [place] die from flu, and many more are hospitalized.     Flu vaccine can:  o keep you from getting flu,  o make flu less severe if you do get it, and  o keep you from spreading flu to your family and other people.     2. Inactivated and recombinant flu vaccines    A dose of flu vaccine is recommended every flu season. Children 6 months through 8 years of age may need two doses during the same flu season.  Everyone else needs only one dose each flu season.       Some inactivated flu vaccines contain a very small amount of a mercury-based preservative called thimerosal. Studies have not shown thimerosal in vaccines to be harmful, but flu vaccines that do not contain  thimerosal are available.    There is no live flu virus in flu shots.  They cannot cause the flu.     There are many flu viruses, and they are always changing. Each year a new flu vaccine is made to protect against three or four viruses that are likely to cause disease in the upcoming flu season. But even when the vaccine doesn't exactly match these viruses, it may still provide some protection    Flu vaccine cannot prevent:  o flu that is caused by a virus not covered by the vaccine, or  o illnesses that look like flu but are not.    It takes about 2 weeks for protection to develop after vaccination, and protection lasts through the flu season.     3. Some people should not get this vaccine    Tell the person who is giving you the vaccine:    o If you have any severe, life-threatening allergies.    If you ever had a life-threatening allergic reaction after a dose of flu vaccine, or have a severe allergy to any part of this vaccine, you may be advised not to get vaccinated.  Most, but not all, types of flu vaccine contain a small amount of egg protein.       o If   you ever had Guillain-Barré Syndrome (also called GBS).   Some people with a history of GBS should not get this vaccine. This should be discussed with your doctor.    o If you are not feeling well.    It is usually okay to get flu vaccine when you have a mild illness, but you might be asked to come back when you feel better.      4. Risks of a vaccine reaction     With any medicine, including vaccines, there is a chance of reactions. These are usually mild and go away on their own, but serious reactions are also possible.     Most people who get a flu shot do not have any problems with it.     Minor problems following a flu shot include:   o soreness, redness, or swelling where the shot was given    o hoarseness  o sore, red or itchy eyes  o cough  o fever  o aches  o headache  o itching  o fatigue  If these problems occur, they usually begin soon after the  shot and last 1 or 2 days.     More serious problems following a flu shot can include the following:    o There may be a small increased risk of Guillain-Barré Syndrome (GBS) after inactivated flu vaccine.  This risk has been estimated at 1 or 2 additional cases per million people vaccinated. This is much lower than the risk of severe complications from flu, which can be prevented by flu vaccine.      o Young children who get the flu shot along with pneumococcal vaccine (PCV13) and/or DTaP vaccine at the same time might be slightly more likely to have a seizure caused by fever. Ask your doctor for more information. Tell your doctor if a child who is getting flu vaccine has ever had a seizure.     Problems that could happen after any injected vaccine:     o People sometimes faint after a medical procedure, including vaccination. Sitting or lying down for about 15 minutes can help prevent fainting, and injuries caused by a fall. Tell your doctor if you feel dizzy, or have vision changes or ringing in the ears.    o Some people get severe pain in the shoulder and have difficulty moving the arm where a shot was given. This happens very rarely.    o Any medication can cause a severe allergic reaction. Such reactions from a vaccine are very rare, estimated at about 1 in a million doses, and would happen within a few minutes to a few hours after the vaccination.    As with any medicine, there is a very remote chance of a vaccine causing a serious injury or death.    The safety of vaccines is always being monitored. For more information, visit: www.cdc.gov/vaccinesafety/    5. What if there is a serious reaction?    What should I look for?    o Look for anything that concerns you, such as signs of a severe allergic reaction, very high fever, or unusual behavior.    Signs of a severe allergic reaction can include hives, swelling of the face and throat, difficulty breathing, a fast heartbeat, dizziness, and weakness - usually  within a few minutes to a few hours after the vaccination.    What should I do?    o If you think it is a severe allergic reaction or other   emergency that can't wait, call 9-1-1 and get the person to the nearest hospital. Otherwise, call your doctor.    o Reactions should be reported to the Vaccine Adverse Event Reporting System (VAERS). Your doctor should file this report, or you can do it yourself through  the VAERS web site at www.vaers.LAgents.no, or by calling 1-727-369-3752.    VAERS does not give medical advice.    6. The National Vaccine Injury Compensation Program    The [employer] Vaccine Injury Compensation Program (VICP) is a federal program that was created to compensate people who may have been injured by certain vaccines.    Persons who believe they may have been injured by a vaccine can learn about the program and about filing a claim by calling 1-360-716-6757 or visiting the VICP website at SpiritualWord.at.  There is a time limit to file a claim for compensation.    7. How can I learn more?  o Ask your healthcare provider. He or she can give you the vaccine package insert or suggest other sources of information.  o Call your local or state health department.  o Contact the Centers for Disease Control and Prevention (CDC):  - Call 956-045-9023 (1-800-CDC-INFO) or  - Visit CDC's website at BiotechRoom.com.cy    Vaccine Information Statement   Inactivated Influenza Vaccine   04/27/2014  42 U.S.C.  [phone]    Department of Health and Insurance risk surveyor for Disease Control and Prevention    Office Use Only    [facility]    PATIENT NAME:  Nathaniel Rowland  MRN:  [id number]  DOB:  05/11/1976  DATE OF SERVICE: 06/26/2015    Patient Discharge Instructions    IMMUNIZATION  You should remain 20 minutes in the clinic after the injection is administered.  If you experience a rash, hives or shortness of breath, return to the Nurse's Station immediately.    The injection site may be reddened, warm  to touch and/or slightly painful.  Cool compress may help.  Tylenol may be taken according to package directions.    Fever usually low grade fever for next 24 hours.    If you should have more severe reaction such as high fever, difficulty breathing or any serious allergic reaction, contact your provider or call 911 immediately.    PPD  ** If you have had a PPD please schedule a follow up appointment in 48-72 hours for the PPD reading.

## 2015-06-26 NOTE — Progress Notes (Signed)
1. Are you 39 years of age or older? yes  3. Have you ever had a severe reaction to a flu shot? no  4. Are you allergic to eggs? no  5. Are you allergic to latex? no  6. Are you allergic to Thimerosol? no  7. Are you experiencing acute illness symptoms or have you been running a fever? no  8. Do you have a medical condition or taking medications that suppress your immune system? no  9. Have you ever had Guillain-Barre syndrome or other neurologic disorder? no  10. Are you pregnant or breastfeeding? no    Immunization administered     Name Date Dose VIS Date Route    INFLUENZA VACCINE IM 06/26/2015 0.5 mL 04/27/2014 Intramuscular    Site: Left arm    Given By: Kathlene November, RN    Manufacturer: Sheran Lawless    Lot: [id number]    Comment: 97.83F    NDC: [id number]          Kathlene November, RN 06/26/2015, 12:32

## 2016-06-21 ENCOUNTER — Other Ambulatory Visit: Payer: Self-pay

## 2018-11-25 ENCOUNTER — Ambulatory Visit: Payer: Self-pay | Admitting: Physician Assistant

## 2018-11-25 VITALS — BP 140/90 | HR 67 | Temp 98.4°F | Resp 16 | Wt 203.0 lb

## 2018-11-25 DIAGNOSIS — H9202 Otalgia, left ear: Secondary | ICD-10-CM

## 2018-11-25 MED ORDER — LORATADINE-PSEUDOEPHEDRINE ER 5-120 MG PO TB12: 1.0000 | ORAL_TABLET | Freq: Two times a day (BID) | ORAL | 0 refills | Status: AC

## 2018-11-25 MED ORDER — AMOXICILLIN-POT CLAVULANATE 875-125 MG PO TABS: 1.0000 | ORAL_TABLET | Freq: Two times a day (BID) | ORAL | 0 refills | Status: AC

## 2018-11-25 MED ORDER — FLUTICASONE PROPIONATE 50 MCG/ACT NA SUSP: 2.0000 | Freq: Every day | NASAL | 0 refills | Status: DC

## 2018-11-25 NOTE — Patient Instructions (Addendum)
Thank you for choosing [facility] for your health care needs.  You have been diagnosed with left otalgia (left ear pain).  Most likely, due to a left ear infection.  Take medications as prescribed: Meds ordered this encounter  Medications  . loratadine-pseudoephedrine (CLARITIN-D 12 HOUR) 5-120 MG tablet    Sig: Take 1 tablet by mouth 2 (two) times daily for 14 days.    Dispense:  28 tablet    Refill:  0    Order Specific Question:   Supervising Provider    Answer:   MILLER, BRIAN [[id number]]  . fluticasone (FLONASE) 50 MCG/ACT nasal spray    Sig: Place 2 sprays into both nostrils daily.    Dispense:  16 g    Refill:  0    Order Specific Question:   Supervising Provider    Answer:   MILLER, BRIAN [[id number]]  . amoxicillin-clavulanate (AUGMENTIN) 875-125 MG tablet    Sig: Take 1 tablet by mouth 2 (two) times daily for 7 days.    Dispense:  14 tablet    Refill:  0    Order Specific Question:   Supervising Provider    Answer:   Eber Hong [[id number]]   Take antibiotic (Augmentin) with food to help prevent stomach upset.  May continue to use over the counter ibuprofen (Motrin or Advil) for pain/discomfort.  Follow-up with family physician, urgent care, or ENT (ear, nose, and throat specialist) if symptoms not improving in a few days.  If you develop dental pain, recommend further evaluation by dentist.  Hope you feel better soon!  Otitis Media, Adult  Otitis media means that the middle ear is red and swollen (inflamed) and full of fluid. The condition usually goes away on its own. Follow these instructions at home:  Take over-the-counter and prescription medicines only as told by your doctor.  If you were prescribed an antibiotic medicine, take it as told by your doctor. Do not stop taking the antibiotic even if you start to feel better.  Keep all follow-up visits as told by your doctor. This is important. Contact a doctor if:  You have bleeding from your nose.  There is a lump on  your neck.  You are not getting better in 5 days.  You feel worse instead of better. Get help right away if:  You have pain that is not helped with medicine.  You have swelling, redness, or pain around your ear.  You get a stiff neck.  You cannot move part of your face (paralyzed).  You notice that the bone behind your ear hurts when you touch it.  You get a very bad headache. Summary  Otitis media means that the middle ear is red, swollen, and full of fluid.  This condition usually goes away on its own. In some cases, treatment may be needed.  If you were prescribed an antibiotic medicine, take it as told by your doctor. This information is not intended to replace advice given to you by your health care provider. Make sure you discuss any questions you have with your health care provider. Document Released: 02/24/2008 Document Revised: 09/28/2016 Document Reviewed: 09/28/2016 Elsevier Interactive Patient Education  2019 [employer].

## 2018-11-25 NOTE — Progress Notes (Signed)
Patient ID: Jeffrey Mann DOB: 1976/09/07 AGE: 43 y.o. MRN: [id number]   PCP: No primary care provider on file.   Chief Complaint:  Chief Complaint  Patient presents with  . Otitis Media    x4d     Subjective:    HPI:  Jeffrey Mann is a 43 y.o. male presents for evaluation  Chief Complaint  Patient presents with  . Otitis Media    x67d    43 year old male presents to [facility] with 5 day history of left ear pain. Began as mild. Dull ache. Intermittent. Has gradually become more severe, primarily yesterday. Constant. Throbbing/pulsating pain, with ache still. This morning developed associated nasal congestion, bilateral maxillary sinus pressure, and scratchy throat. Patient has also developed cervical lymphadenopathy and left sided jaw pain (at ramus). Has taken OTC Advil with minimal improvement. Has placed several essential oils in left ear canal with no improvement. Denies fever, chills, sweats, body aches, headache, dizziness/lightheadedness, ear discharge/drainage, tinnitus, muffled hearing, popping/crackling, epistaxis, cough, chest pain, SOB, wheezing, nausea/vomiting. Denies electric shocks on face. Denies temporal headache.  Patient denies recent swimming; vacation, hot tub, pool. Patient denies use of ear plugs, hearing aides or Q-tips. Patient does regularly use ear buds/pods to listen to music. Denies recent significant change in elevation; plane flight, hiking tall mountain, or diving. Patient states he is not prone to ear infections.  Patient regularly seen by dentist. Last seen 1-2 months ago. Typically seen twice a year. Previous history of dental caries. Previous root canal. Denies previous history of dental abscess. Denies pain with mastication. Denies gum swelling, purulent drainage, or bleeding.  A limited review of symptoms was performed, pertinent positives and negatives as mentioned in HPI.  The following portions of the patient's history were  reviewed and updated as appropriate: allergies, current medications and past medical history.  There are no active problems to display for this patient.   Not on File  No current outpatient medications on file prior to visit.   No current facility-administered medications on file prior to visit.        Objective:   Vitals:   11/25/18 1223  BP: 140/90  Pulse: 67  Resp: 16  Temp: 98.4 F (36.9 C)  SpO2: 97%     Wt Readings from Last 3 Encounters:  11/25/18 203 lb (92.1 kg)    Physical Exam:   General Appearance:  Patient sitting comfortably on examination table. Conversational. Kermit Balo self-historian. In no acute distress. Afebrile.   Head:  Normocephalic, without obvious abnormality, atraumatic Face normal to inspection. No edema, erythema or ecchymosis. No facial drooping. No tenderness with palpation over face.  Eyes:  PERRL, conjunctiva/corneas clear, EOM's intact  Ears:  Left ear canal: No erythema or edema. No open wound. No visible purulent drainage. Mild tenderness with palpation over left tragus. No pain with manipulation of auricle. No pain with insertion of otoscope. No visible erythema or edema of left mastoid. No tenderness with palpation over left mastoid. Right ear canal WNL. No erythema or edema. No open wound. No visible purulent drainage. No tenderness with palpation over right tragus or with manipulation of right auricle. No visible erythema or edema of right mastoid. No tenderness with palpation over right mastoid. Left TM: Good light reflex. Visible landmarks. No erythema. No injection. No retraction; minimal bulging. No visible perforation. Possible purulence at inferior portion of TM. No tympanostomy tube. No scar tissue. Right TM WNL. Good light reflex. Visible landmarks. No erythema. No injection. No bulging  or retraction. No visible perforation. No serous effusion. No visible purulent effusion. No tympanostomy tube. No scar tissue.  Nose: Nares normal.  Septum midline. No visible polyps. No discharge. Normal mucosa with minimal bogginess. No sinus tenderness with percussion/palpation.  Throat: Lips, mucosa, and tongue normal; teeth and gums normal. No gum edema, erythema, bleeding, or purulent drainage. Teeth in good condition. No visible broken tooth. No tenderness with palpation over upper left posterior teeth. Throat reveals no erythema. No postnasal drip. No visible cobblestoning. Tonsils with no enlargement or exudate. Uvula midline with no edema or erythema. No trismus.  Neck: Supple, symmetrical, trachea midline, no palpable cervical or auricular lymphadenopathy. No tenderness with palpation along jaw. No mass/edema at inferior aspect of jaw.  Lungs:   Clear to auscultation bilaterally, respirations unlabored. Good aeration. No rales, rhonchi, crackles or wheezing.  Heart:  Regular rate and rhythm, S1 and S2 normal, no murmur, rub, or gallop  Extremities: Extremities normal, atraumatic, no cyanosis or edema  Pulses: 2+ and symmetric  Skin: Skin color, texture, turgor normal, no rashes or lesions  Lymph nodes: Cervical, supraclavicular, and axillary nodes normal  Neurologic: Normal    Assessment & Plan:    Exam findings, diagnosis etiology and medication use and indications reviewed with patient. Follow-Up and discharge instructions provided. No emergent/urgent issues found on exam.  Patient education was provided.   Patient verbalized understanding of information provided and agrees with plan of care (POC), all questions answered. The patient is advised to call or return to clinic if condition does not see an improvement in symptoms, or to seek the care of the closest emergency department if condition worsens with the below plan.    1. Acute otalgia, left  - loratadine-pseudoephedrine (CLARITIN-D 12 HOUR) 5-120 MG tablet; Take 1 tablet by mouth 2 (two) times daily for 14 days.  Dispense: 28 tablet; Refill: 0 - fluticasone (FLONASE) 50  MCG/ACT nasal spray; Place 2 sprays into both nostrils daily.  Dispense: 16 g; Refill: 0 - amoxicillin-clavulanate (AUGMENTIN) 875-125 MG tablet; Take 1 tablet by mouth 2 (two) times daily for 7 days.  Dispense: 14 tablet; Refill: 0  Patient with five day history of gradually worsening left ear pain. New development of associated nasal congestion and sinus pressure. Also new development of associated left jaw pain. Suspect pain due to AOM (though, fairly benign TM on physical exam) or ETD. VSS, afebrile, in no acute distress. Prescribed Claritin-D, Flonase, and Augmentin. Discussed concern for possible dental infection, can cause referred ear pain. Also discussed possibility of TMJ disorder; however, no previous history, no pain with opening jaw wide, no noticeable TMJ laxity or popping. Not classic presentation, but differential includes temporal arteritis (no indication for ESR or temporal artery biopsy at this time), trigeminal neuralgia, sialoadenitis. Advised patient seek further evaluation with PCP, urgent care, or ENT in a few days if symptoms not improving, sooner with any worsening symptoms. Patient agrees with plan.    Darlin Priestly, MHS, PA-C Montey Hora, MHS, PA-C Advanced Practice Provider [facility]  [address], [facility], [place], [place] [address] (p):  [phone] [email or web address]

## 2018-11-30 ENCOUNTER — Telehealth: Payer: Self-pay | Admitting: Emergency Medicine

## 2018-11-30 NOTE — Telephone Encounter (Signed)
Spoke with patient whom informed me that he is doing way better. This was a follow up call from [facility] visit

## 2018-12-13 ENCOUNTER — Emergency Department: Payer: Managed Care, Other (non HMO)

## 2018-12-13 ENCOUNTER — Other Ambulatory Visit: Payer: Self-pay

## 2018-12-13 ENCOUNTER — Emergency Department
Admission: EM | Admit: 2018-12-13 | Discharge: 2018-12-13 | Disposition: A | Discharge status: Home / Self Care | Payer: Managed Care, Other (non HMO) | Attending: Emergency Medicine | Admitting: Emergency Medicine

## 2018-12-13 ENCOUNTER — Encounter: Payer: Self-pay | Admitting: Emergency Medicine

## 2018-12-13 DIAGNOSIS — N23 Unspecified renal colic: Secondary | ICD-10-CM | POA: Diagnosis not present

## 2018-12-13 DIAGNOSIS — R109 Unspecified abdominal pain: Secondary | ICD-10-CM | POA: Diagnosis present

## 2018-12-13 HISTORY — DX: Calculus of kidney: N20.0

## 2018-12-13 LAB — URINALYSIS, COMPLETE (UACMP) WITH MICROSCOPIC
Bacteria, UA: NONE SEEN
Bilirubin Urine: NEGATIVE
Glucose, UA: NEGATIVE mg/dL
Hgb urine dipstick: NEGATIVE
Ketones, ur: NEGATIVE mg/dL
Leukocytes,Ua: NEGATIVE
Nitrite: NEGATIVE
Protein, ur: NEGATIVE mg/dL
SPECIFIC GRAVITY, URINE: 1.011 (ref 1.005–1.030)
pH: 8 (ref 5.0–8.0)

## 2018-12-13 LAB — CBC WITH DIFFERENTIAL/PLATELET
Abs Immature Granulocytes: 0.05 10*3/uL (ref 0.00–0.07)
Basophils Absolute: 0 10*3/uL (ref 0.0–0.1)
Basophils Relative: 0 %
EOS PCT: 0 %
Eosinophils Absolute: 0.1 10*3/uL (ref 0.0–0.5)
HCT: 43.6 % (ref 39.0–52.0)
Hemoglobin: 15.2 g/dL (ref 13.0–17.0)
Immature Granulocytes: 0 %
Lymphocytes Relative: 14 %
Lymphs Abs: 1.9 10*3/uL (ref 0.7–4.0)
MCH: 30.5 pg (ref 26.0–34.0)
MCHC: 34.9 g/dL (ref 30.0–36.0)
MCV: 87.6 fL (ref 80.0–100.0)
Monocytes Absolute: 0.8 10*3/uL (ref 0.1–1.0)
Monocytes Relative: 6 %
Neutro Abs: 10.7 10*3/uL — ABNORMAL HIGH (ref 1.7–7.7)
Neutrophils Relative %: 80 %
PLATELETS: 212 10*3/uL (ref 150–400)
RBC: 4.98 MIL/uL (ref 4.22–5.81)
RDW: 11.8 % (ref 11.5–15.5)
WBC: 13.5 10*3/uL — ABNORMAL HIGH (ref 4.0–10.5)
nRBC: 0 % (ref 0.0–0.2)

## 2018-12-13 LAB — BASIC METABOLIC PANEL
Anion gap: 10 (ref 5–15)
BUN: 11 mg/dL (ref 6–20)
CO2: 26 mmol/L (ref 22–32)
CREATININE: 0.98 mg/dL (ref 0.61–1.24)
Calcium: 9.3 mg/dL (ref 8.9–10.3)
Chloride: 100 mmol/L (ref 98–111)
GFR calc Af Amer: >60 mL/min (ref >60)
Glucose, Bld: 123 mg/dL — ABNORMAL HIGH (ref 70–99)
Potassium: 3.9 mmol/L (ref 3.5–5.1)
Sodium: 136 mmol/L (ref 135–145)

## 2018-12-13 MED ORDER — ONDANSETRON HCL 4 MG/2ML IJ SOLN
4.0000 mg | Freq: Once | INTRAMUSCULAR | Status: AC
  Administered 2018-12-13: 4 mg via INTRAVENOUS
  Filled 2018-12-13: qty 2

## 2018-12-13 MED ORDER — HYDROMORPHONE HCL 1 MG/ML IJ SOLN
0.5000 mg | Freq: Once | INTRAMUSCULAR | Status: AC
  Administered 2018-12-13: 0.5 mg via INTRAVENOUS
  Filled 2018-12-13: qty 1

## 2018-12-13 MED ORDER — OXYCODONE-ACETAMINOPHEN 5-325 MG PO TABS: 1.0000 | ORAL_TABLET | ORAL | 0 refills | Status: DC | PRN

## 2018-12-13 MED ORDER — KETOROLAC TROMETHAMINE 30 MG/ML IJ SOLN
30.0000 mg | Freq: Once | INTRAMUSCULAR | Status: AC
  Administered 2018-12-13: 30 mg via INTRAVENOUS
  Filled 2018-12-13: qty 1

## 2018-12-13 NOTE — ED Notes (Signed)
MD at the bedside for pt evaluation  

## 2018-12-13 NOTE — ED Notes (Signed)
Pt to xray

## 2018-12-13 NOTE — Discharge Instructions (Signed)
Please take the Percocet 1 pill 4 times a day as needed for pain.  If you do not need to do not take it.  Strain all your urine and try to catch the stone.  Take the stone to the urologist.  Dr. Apolinar Junes is the urologist here in town.  You can see her or your regular urologist.  Return here for worse pain fever vomiting or feeling sicker.

## 2018-12-13 NOTE — ED Notes (Signed)
Dr. Darnelle Catalan at the bedside to discuss xray results.

## 2018-12-13 NOTE — ED Notes (Signed)
Pt returned from xray

## 2018-12-13 NOTE — ED Provider Notes (Signed)
[facility] Emergency Department Provider Note   ____________________________________________   First MD Initiated Contact with Patient 12/13/18 0335     (approximate)  I have reviewed the triage vital signs and the nursing notes.   HISTORY  Chief Complaint Flank Pain    HPI Jeffrey Mann is a 43 y.o. male who complains of right-sided flank pain consistent with his previous 3-4 kidney stones.  He says it started at 9:00 is radiating down toward the groin sharp and crampy in nature.  Moderately severe.  He is vomited a couple times and had some chills but no fever.         Past Medical History:  Diagnosis Date  . Kidney stone     There are no active problems to display for this patient.   History reviewed. No pertinent surgical history.  Prior to Admission medications   Medication Sig Start Date End Date Taking? Authorizing Provider  fluticasone (FLONASE) 50 MCG/ACT nasal spray Place 2 sprays into both nostrils daily. 11/25/18   Janalyn Harder, PA-C    Allergies Patient has no known allergies.  No family history on file.  Social History Social History   Tobacco Use  . Smoking status: Not on file  Substance Use Topics  . Alcohol use: Not on file  . Drug use: Not on file    Review of Systems  Constitutional: No fever Eyes: No visual changes. ENT: No sore throat. Cardiovascular: Denies chest pain. Respiratory: Denies shortness of breath. Gastrointestinal: abdominal pain.   nausea, vomiting.  No diarrhea.  No constipation. Genitourinary: Negative for dysuria! Musculoskeletal: Negative for back pain. Skin: Negative for rash. Neurological: Negative for headaches, focal weakness   ____________________________________________   PHYSICAL EXAM:  VITAL SIGNS: ED Triage Vitals  Enc Vitals Group     BP 12/13/18 0330 (!) 145/95     Pulse Rate 12/13/18 0330 66     Resp 12/13/18 0330 17     Temp 12/13/18 0330 98.6 F (37 C)    Temp Source 12/13/18 0330 Oral     SpO2 12/13/18 0330 100 %     Weight 12/13/18 0327 200 lb (90.7 kg)     Height 12/13/18 0327 5\' 10"  (1.778 m)     Head Circumference --      Peak Flow --      Pain Score 12/13/18 0327 10     Pain Loc --      Pain Edu? --      Excl. in GC? --     Constitutional: Alert and oriented.  Uncomfortable standing up in the room Eyes: Conjunctivae are normal.  Head: Atraumatic. Nose: No congestion/rhinnorhea. Mouth/Throat: Mucous membranes are moist.  Oropharynx non-erythematous. Neck: No stridor.  Cardiovascular: Normal rate, regular rhythm. Grossly normal heart sounds.  Good peripheral circulation. Respiratory: Normal respiratory effort.  No retractions. Lungs CTAB. Gastrointestinal: Soft and nontender. No distention. No abdominal bruits.  Right CVA tenderness. Musculoskeletal: No lower extremity tenderness nor edema.  No joint effusions. Neurologic:  Normal speech and language. No gross focal neurologic deficits are appreciated. No gait instability. Skin:  Skin is warm, dry and intact. No rash noted.   ____________________________________________   LABS (all labs ordered are listed, but only abnormal results are displayed)  Labs Reviewed  BASIC METABOLIC PANEL - Abnormal; Notable for the following components:      Result Value   Glucose, Bld 123 (*)    All other components within normal limits  CBC WITH DIFFERENTIAL/PLATELET -  Abnormal; Notable for the following components:   WBC 13.5 (*)    Neutro Abs 10.7 (*)    All other components within normal limits  URINALYSIS, COMPLETE (UACMP) WITH MICROSCOPIC - Abnormal; Notable for the following components:   Color, Urine YELLOW (*)    APPearance CLOUDY (*)    All other components within normal limits   ____________________________________________  EKG   ____________________________________________  RADIOLOGY  ED MD interpretation: KUB read interpreted by me shows what appears to be a 4 mm  stone in the pelvis.  Official radiology report(s): Dg Abdomen 1 View  Result Date: 12/13/2018 CLINICAL DATA:  43 year old male with right flank pain since 2100 hours. History of kidney stones. EXAM: ABDOMEN - 1 VIEW COMPARISON:  None. FINDINGS: Two supine views. Negative lung bases. Non obstructed bowel gas pattern. No calculi identified at the level of the renal shadows, but there is a 4 millimeter round calculus in the right hemipelvis projecting over the urinary bladder. No acute osseous abnormality identified. IMPRESSION: 1. Round 4 mm calculus in the right hemipelvis suspicious for a distal right ureter or UVJ stone in this clinical setting. 2. Normal bowel gas pattern. Electronically Signed   By: Odessa Fleming M.D.   On: 12/13/2018 03:51    ____________________________________________   PROCEDURES  Procedure(s) performed (including Critical Care):  Procedures   ____________________________________________   INITIAL IMPRESSION / ASSESSMENT AND PLAN / ED COURSE   Patient's pain is controlled with half a milligram of Dilaudid.  We will let him go.  Urine shows lots of red cells nothing else.  Abdominal exam is nontender              ____________________________________________   FINAL CLINICAL IMPRESSION(S) / ED DIAGNOSES  Final diagnoses:  Renal colic     ED Discharge Orders    None       Note:  This document was prepared using Dragon voice recognition software and may include unintentional dictation errors.    Arnaldo Natal, MD 12/13/18 [phone]

## 2018-12-13 NOTE — ED Triage Notes (Signed)
Patient ambulatory to triage with steady gait, without difficulty or distress noted; pt reports right flank radiating into abd since 9pm; st hx kidney stones

## 2019-09-11 ENCOUNTER — Encounter: Payer: Self-pay | Admitting: Emergency Medicine

## 2019-09-11 ENCOUNTER — Other Ambulatory Visit: Payer: Self-pay

## 2019-09-11 ENCOUNTER — Ambulatory Visit
Admission: EM | Admit: 2019-09-11 | Discharge: 2019-09-11 | Disposition: A | Discharge status: Home / Self Care | Payer: Managed Care, Other (non HMO) | Attending: Emergency Medicine | Admitting: Emergency Medicine

## 2019-09-11 DIAGNOSIS — R300 Dysuria: Secondary | ICD-10-CM | POA: Diagnosis present

## 2019-09-11 LAB — POCT URINALYSIS DIP (MANUAL ENTRY)
Bilirubin, UA: NEGATIVE
Glucose, UA: NEGATIVE mg/dL
Ketones, POC UA: NEGATIVE mg/dL
Leukocytes, UA: NEGATIVE
Nitrite, UA: NEGATIVE
Protein Ur, POC: NEGATIVE mg/dL
Spec Grav, UA: 1.015 (ref 1.010–1.025)
Urobilinogen, UA: 0.2 E.U./dL
pH, UA: 7 (ref 5.0–8.0)

## 2019-09-11 NOTE — Discharge Instructions (Addendum)
Your urine did not show signs of an infection but did have a trace amount of blood.  A urine culture is pending.    The urethral swab for STDs is pending.  We will call you if any of your test results are positive requiring treatment.    Use the urine strainer and follow up with the Urologist listed below if you have more kidney stones.    Your blood pressure is elevated today at 144/99.  Please have this rechecked by your primary care provider in 2 weeks.

## 2019-09-11 NOTE — ED Triage Notes (Signed)
Patient in office today c/o painful urination,freq and abd pain   OTC:Ibu, cranberry

## 2019-09-11 NOTE — ED Provider Notes (Signed)
Renaldo Fiddler    CSN: [id number] Arrival date & time: 09/11/19  1102      History   Chief Complaint Chief Complaint  Patient presents with  . Urinary Tract Infection    HPI Jeffrey Mann is a 43 y.o. male. Patient presents with urinary frequency and dysuria x 3 days.  He also reports intermittent generalized abdominal pain; at worst is 5/10 but none currently; no known aggravating or alleviating factors.  He has a history of kidney stones but states he does not usually have urinary symptoms with these.  He denies fever, chills, current abdominal pain, back/flank pain, penile discharge, testicular pain, rash, lesions, or other symptoms.  He has attempted treatment at home with cranberry and ibuprofen.  He is sexually active but in a long-term monogamous relationship.  The history is provided by the patient.    Past Medical History:  Diagnosis Date  . Kidney stone     There are no problems to display for this patient.   History reviewed. No pertinent surgical history.     Home Medications    Prior to Admission medications   Medication Sig Start Date End Date Taking? Authorizing Provider  fluticasone (FLONASE) 50 MCG/ACT nasal spray Place 2 sprays into both nostrils daily. 11/25/18   Janalyn Harder, PA-C  oxyCODONE-acetaminophen (PERCOCET) 5-325 MG tablet Take 1 tablet by mouth every 4 (four) hours as needed for severe pain. 12/13/18 12/13/19  Arnaldo Natal, MD    Family History History reviewed. No pertinent family history.  Social History Social History   Tobacco Use  . Smoking status: Never Smoker  . Smokeless tobacco: Never Used  Substance Use Topics  . Alcohol use: Yes  . Drug use: Never     Allergies   Patient has no known allergies.   Review of Systems Review of Systems  Constitutional: Negative for chills and fever.  HENT: Negative for ear pain and sore throat.   Eyes: Negative for pain and visual disturbance.  Respiratory: Negative  for cough and shortness of breath.   Cardiovascular: Negative for chest pain and palpitations.  Gastrointestinal: Positive for abdominal pain. Negative for diarrhea and vomiting.  Genitourinary: Positive for dysuria and frequency. Negative for discharge, hematuria and testicular pain.  Musculoskeletal: Negative for arthralgias and back pain.  Skin: Negative for color change and rash.  Neurological: Negative for seizures and syncope.  All other systems reviewed and are negative.    Physical Exam Triage Vital Signs ED Triage Vitals  Enc Vitals Group     BP      Pulse      Resp      Temp      Temp src      SpO2      Weight      Height      Head Circumference      Peak Flow      Pain Score      Pain Loc      Pain Edu?      Excl. in GC?    No data found.  Updated Vital Signs BP (!) 144/99 (BP Location: Left Arm)   Pulse (!) 59   Temp 98 F (36.7 C)   Resp 18   Wt 200 lb (90.7 kg)   SpO2 98%   BMI 28.70 kg/m   Visual Acuity Right Eye Distance:   Left Eye Distance:   Bilateral Distance:    Right Eye Near:   Left Eye Near:  Bilateral Near:     Physical Exam Vitals and nursing note reviewed.  Constitutional:      General: He is not in acute distress.    Appearance: He is well-developed. He is not ill-appearing.  HENT:     Head: Normocephalic and atraumatic.     Mouth/Throat:     Mouth: Mucous membranes are moist.     Pharynx: Oropharynx is clear.  Eyes:     Conjunctiva/sclera: Conjunctivae normal.  Cardiovascular:     Rate and Rhythm: Normal rate and regular rhythm.     Heart sounds: No murmur.  Pulmonary:     Effort: Pulmonary effort is normal. No respiratory distress.     Breath sounds: Normal breath sounds.  Abdominal:     General: Bowel sounds are normal.     Palpations: Abdomen is soft.     Tenderness: There is no abdominal tenderness. There is no right CVA tenderness, left CVA tenderness, guarding or rebound.  Genitourinary:    Penis: Normal.       Testes: Normal.  Musculoskeletal:     Cervical back: Neck supple.  Skin:    General: Skin is warm and dry.     Findings: No lesion or rash.  Neurological:     General: No focal deficit present.     Mental Status: He is alert and oriented to person, place, and time.  Psychiatric:        Mood and Affect: Mood normal.        Behavior: Behavior normal.      UC Treatments / Results  Labs (all labs ordered are listed, but only abnormal results are displayed) Labs Reviewed  POCT URINALYSIS DIP (MANUAL ENTRY) - Abnormal; Notable for the following components:      Result Value   Blood, UA trace-intact (*)    All other components within normal limits  URINE CULTURE  CYTOLOGY, (ORAL, ANAL, URETHRAL) ANCILLARY ONLY    EKG   Radiology No results found.  Procedures Procedures (including critical care time)  Medications Ordered in UC Medications - No data to display  Initial Impression / Assessment and Plan / UC Course  I have reviewed the triage vital signs and the nursing notes.  Pertinent labs & imaging results that were available during my care of the patient were reviewed by me and considered in my medical decision making (see chart for details).    Dysuria.  Urine culture pending.  Urethral swab for STDs pending.  Urine strainer provided.  Discussed with patient that we will call him if his urine culture shows the need for treatment.  Discussed that we will call him if his STD tests are positive requiring treatment.  Instructed patient to follow-up with a urologist if he passes any kidney stones.  Discussed with patient that his blood pressure is elevated today and needs to be rechecked by his PCP in 2 to 4 weeks.  Patient agrees to this plan of care.     Final Clinical Impressions(s) / UC Diagnoses   Final diagnoses:  Dysuria     Discharge Instructions     Your urine did not show signs of an infection but did have a trace amount of blood.  A urine culture is  pending.    The urethral swab for STDs is pending.  We will call you if any of your test results are positive requiring treatment.    Use the urine strainer and follow up with the Urologist listed below if you have  more kidney stones.    Your blood pressure is elevated today at 144/99.  Please have this rechecked by your primary care provider in 2 weeks.      ED Prescriptions    None     PDMP not reviewed this encounter.   Mickie Bailate, Emelynn Rance H, NP 09/11/19 1209

## 2019-09-12 LAB — URINE CULTURE: Culture: NO GROWTH

## 2019-09-13 LAB — CYTOLOGY, (ORAL, ANAL, URETHRAL) ANCILLARY ONLY
Chlamydia: NEGATIVE
Neisseria Gonorrhea: NEGATIVE
Trichomonas: NEGATIVE

## 2019-10-25 ENCOUNTER — Other Ambulatory Visit: Payer: Self-pay

## 2019-10-25 ENCOUNTER — Encounter: Payer: Self-pay | Admitting: Urology

## 2019-10-25 ENCOUNTER — Ambulatory Visit
Admission: RE | Admit: 2019-10-25 | Discharge: 2019-10-25 | Disposition: A | Discharge status: Home / Self Care | Payer: Managed Care, Other (non HMO) | Source: Ambulatory Visit | Attending: Urology | Admitting: Urology

## 2019-10-25 ENCOUNTER — Ambulatory Visit (INDEPENDENT_AMBULATORY_CARE_PROVIDER_SITE_OTHER): Payer: Managed Care, Other (non HMO) | Admitting: Urology

## 2019-10-25 VITALS — BP 169/97 | HR 76 | Ht 69.0 in | Wt 195.0 lb

## 2019-10-25 DIAGNOSIS — N200 Calculus of kidney: Secondary | ICD-10-CM

## 2019-10-25 NOTE — Patient Instructions (Addendum)
Will call with x-ray and stone analysis results    Dietary Guidelines to Help Prevent Kidney Stones Kidney stones are deposits of minerals and salts that form inside your kidneys. Your risk of developing kidney stones may be greater depending on your diet, your lifestyle, the medicines you take, and whether you have certain medical conditions. Most people can reduce their chances of developing kidney stones by following the instructions below. Depending on your overall health and the type of kidney stones you tend to develop, your dietitian may give you more specific instructions. What are tips for following this plan? Reading food labels  Choose foods with "no salt added" or "low-salt" labels. Limit your sodium intake to less than 1500 mg per day.  Choose foods with calcium for each meal and snack. Try to eat about 300 mg of calcium at each meal. Foods that contain 200-500 mg of calcium per serving include: ? 8 oz (237 ml) of milk, fortified nondairy milk, and fortified fruit juice. ? 8 oz (237 ml) of kefir, yogurt, and soy yogurt. ? 4 oz (118 ml) of tofu. ? 1 oz of cheese. ? 1 cup (300 g) of dried figs. ? 1 cup (91 g) of cooked broccoli. ? 1-3 oz can of sardines or mackerel.  Most people need 1000 to 1500 mg of calcium each day. Talk to your dietitian about how much calcium is recommended for you. Shopping  Buy plenty of fresh fruits and vegetables. Most people do not need to avoid fruits and vegetables, even if they contain nutrients that may contribute to kidney stones.  When shopping for convenience foods, choose: ? Whole pieces of fruit. ? Premade salads with dressing on the side. ? Low-fat fruit and yogurt smoothies.  Avoid buying frozen meals or prepared deli foods.  Look for foods with live cultures, such as yogurt and kefir. Cooking  Do not add salt to food when cooking. Place a salt shaker on the table and allow each person to add his or her own salt to taste.  Use  vegetable protein, such as beans, textured vegetable protein (TVP), or tofu instead of meat in pasta, casseroles, and soups. Meal planning   Eat less salt, if told by your dietitian. To do this: ? Avoid eating processed or premade food. ? Avoid eating fast food.  Eat less animal protein, including cheese, meat, poultry, or fish, if told by your dietitian. To do this: ? Limit the number of times you have meat, poultry, fish, or cheese each week. Eat a diet free of meat at least 2 days a week. ? Eat only one serving each day of meat, poultry, fish, or seafood. ? When you prepare animal protein, cut pieces into small portion sizes. For most meat and fish, one serving is about the size of one deck of cards.  Eat at least 5 servings of fresh fruits and vegetables each day. To do this: ? Keep fruits and vegetables on hand for snacks. ? Eat 1 piece of fruit or a handful of berries with breakfast. ? Have a salad and fruit at lunch. ? Have two kinds of vegetables at dinner.  Limit foods that are high in a substance called oxalate. These include: ? Spinach. ? Rhubarb. ? Beets. ? Potato chips and french fries. ? Nuts.  If you regularly take a diuretic medicine, make sure to eat at least 1-2 fruits or vegetables high in potassium each day. These include: ? Avocado. ? Banana. ? Orange, prune, carrot, or  tomato juice. ? Baked potato. ? Cabbage. ? Beans and split peas. General instructions   Drink enough fluid to keep your urine clear or pale yellow. This is the most important thing you can do.  Talk to your health care provider and dietitian about taking daily supplements. Depending on your health and the cause of your kidney stones, you may be advised: ? Not to take supplements with vitamin C. ? To take a calcium supplement. ? To take a daily probiotic supplement. ? To take other supplements such as magnesium, fish oil, or vitamin B6.  Take all medicines and supplements as told by your  health care provider.  Limit alcohol intake to no more than 1 drink a day for nonpregnant women and 2 drinks a day for men. One drink equals 12 oz of beer, 5 oz of wine, or 1 oz of hard liquor.  Lose weight if told by your health care provider. Work with your dietitian to find strategies and an eating plan that works best for you. What foods are not recommended? Limit your intake of the following foods, or as told by your dietitian. Talk to your dietitian about specific foods you should avoid based on the type of kidney stones and your overall health. Grains Breads. Bagels. Rolls. Baked goods. Salted crackers. Cereal. Pasta. Vegetables Spinach. Rhubarb. Beets. Canned vegetables. Angie Fava. Olives. Meats and other protein foods Nuts. Nut butters. Large portions of meat, poultry, or fish. Salted or cured meats. Deli meats. Hot dogs. Sausages. Dairy Cheese. Beverages Regular soft drinks. Regular vegetable juice. Seasonings and other foods Seasoning blends with salt. Salad dressings. Canned soups. Soy sauce. Ketchup. Barbecue sauce. Canned pasta sauce. Casseroles. Pizza. Lasagna. Frozen meals. Potato chips. [place] fries. Summary  You can reduce your risk of kidney stones by making changes to your diet.  The most important thing you can do is drink enough fluid. You should drink enough fluid to keep your urine clear or pale yellow.  Ask your health care provider or dietitian how much protein from animal sources you should eat each day, and also how much salt and calcium you should have each day. This information is not intended to replace advice given to you by your health care provider. Make sure you discuss any questions you have with your health care provider. Document Revised: 12/28/2018 Document Reviewed: 08/18/2016 Elsevier Patient Education  2020 [employer].

## 2019-10-25 NOTE — Progress Notes (Signed)
   10/25/19 9:27 AM   Jeffrey Mann 09/16/1976 [id number]  CC: Nephrolithiasis  HPI: I saw Jeffrey Mann in urology clinic to establish care for nephrolithiasis.  He denies any acute symptoms at this time, but recently passed an approximately 8 mm stone with abdominal pain which he brought with him today.  He is an otherwise healthy 44 year old male with a history of around 6 spontaneously passed stones over the last 20 years.  He has never required surgical intervention for stones.  There is no prior cross-sectional imaging to review.  His only prior imaging is a KUB from March 2020 that showed a 4 mm likely calculus in the right distal ureter, but no renal stones.  He denies any urinary symptoms or gross hematuria.   PMH: Past Medical History:  Diagnosis Date  . Kidney stone     Surgical History: No past surgical history on file.  Allergies: No Known Allergies   Social History:  reports that he has never smoked. He has never used smokeless tobacco. He reports current alcohol use. He reports that he does not use drugs.  ROS: Please see flowsheet from today's date for complete review of systems.  Physical Exam: BP (!) 169/97 (BP Location: Left Arm, Patient Position: Sitting, Cuff Size: Normal)   Pulse 76   Ht 5\' 9"  (1.753 m)   Wt 195 lb (88.5 kg)   BMI 28.80 kg/m    Constitutional:  Alert and oriented, No acute distress. Cardiovascular: No clubbing, cyanosis, or edema. Respiratory: Normal respiratory effort, no increased work of breathing. GI: Abdomen is soft, nontender, nondistended, no abdominal masses GU: No CVA tenderness Lymph: No cervical or inguinal lymphadenopathy. Skin: No rashes, bruises or suspicious lesions. Neurologic: Grossly intact, no focal deficits, moving all 4 extremities. Psychiatric: Normal mood and affect.  Laboratory Data: Urinalysis today pending  Pertinent Imaging: See HPI  Assessment & Plan:   In summary, he is a healthy 44 year old male  with ~6 spontaneously passed stones over the last 20 years.  Most recently passed stone was approximately 2 weeks ago, and he denies any complaints today.  We discussed general stone prevention strategies including adequate hydration with goal of producing 2.5 L of urine daily, increasing citric acid intake, increasing calcium intake during high oxalate meals, minimizing animal protein, and decreasing salt intake. Information about dietary recommendations given today.   Will call with KUB and stone analysis results RTC 1 year for KUB Consider metabolic work-up and 24-hour urine if recurrent stones despite above strategies  A total of 30 minutes were spent face-to-face with the patient, greater than 50% was spent in patient education, counseling, and coordination of care regarding nephrolithiasis.   55, MD  [facility] [address], [address] [place], [place] [phone]

## 2019-10-26 ENCOUNTER — Telehealth: Payer: Self-pay

## 2019-10-26 DIAGNOSIS — N200 Calculus of kidney: Secondary | ICD-10-CM

## 2019-10-26 LAB — URINALYSIS, COMPLETE
Bilirubin, UA: NEGATIVE
Glucose, UA: NEGATIVE
Nitrite, UA: NEGATIVE
Protein,UA: NEGATIVE
RBC, UA: NEGATIVE
Specific Gravity, UA: 1.015 (ref 1.005–1.030)
Urobilinogen, Ur: 0.2 mg/dL (ref 0.2–1.0)
pH, UA: 6.5 (ref 5.0–7.5)

## 2019-10-26 LAB — MICROSCOPIC EXAMINATION
Bacteria, UA: NONE SEEN
RBC, Urine: NONE SEEN /hpf (ref 0–2)

## 2019-10-26 NOTE — Telephone Encounter (Signed)
-----   Message from Sondra Come, MD sent at 10/26/2019 10:23 AM EST ----- There was a small calcification on his KUB that could potentially be a blocking stone even though he does not have symptoms. Please order renal US this week and will call with results. This is inexpensive and no radiation. We would need to think about a CT though if [place] is abnormal. Thanks  Legrand Rams, MD 10/26/2019

## 2019-10-26 NOTE — Telephone Encounter (Signed)
Called pt informed him of the information below. Pt gave verbal understanding. Order placed.

## 2019-10-30 ENCOUNTER — Other Ambulatory Visit: Payer: Self-pay | Admitting: Urology

## 2019-10-31 ENCOUNTER — Telehealth: Payer: Self-pay

## 2019-10-31 NOTE — Progress Notes (Signed)
His stone was calcium oxalate, the most common type of stone. Continue prevention strategies as discussed in clinic.  Legrand Rams, MD 10/31/2019

## 2019-10-31 NOTE — Telephone Encounter (Signed)
-----   Message from Sondra Come, MD sent at 10/31/2019  8:48 AM EST -----   ----- Message ----- From: Kipp Brood Sent: 10/30/2019   1:37 PM EST To: Sondra Come, MD

## 2019-11-10 ENCOUNTER — Other Ambulatory Visit: Payer: Self-pay

## 2019-11-10 ENCOUNTER — Ambulatory Visit
Admission: RE | Admit: 2019-11-10 | Discharge: 2019-11-10 | Disposition: A | Discharge status: Home / Self Care | Payer: Managed Care, Other (non HMO) | Source: Ambulatory Visit | Attending: Urology | Admitting: Urology

## 2019-11-10 DIAGNOSIS — N200 Calculus of kidney: Secondary | ICD-10-CM | POA: Insufficient documentation

## 2019-11-14 ENCOUNTER — Telehealth: Payer: Self-pay

## 2019-11-14 NOTE — Telephone Encounter (Signed)
-----   Message from Sondra Come, MD sent at 11/14/2019 12:38 PM EST ----- No blockage or stones on renal US, keep follow up as scheduled  Legrand Rams, MD 11/14/2019

## 2019-11-14 NOTE — Telephone Encounter (Signed)
See my chart message

## 2019-12-08 ENCOUNTER — Ambulatory Visit: Payer: Managed Care, Other (non HMO) | Attending: Internal Medicine

## 2019-12-08 DIAGNOSIS — Z23 Encounter for immunization: Secondary | ICD-10-CM

## 2019-12-08 NOTE — Progress Notes (Signed)
   Covid-19 Vaccination Clinic  Name:  Jeffrey Mann    MRN: [id number] DOB: 09/21/1976  12/08/2019  Mr. Cotta was observed post Covid-19 immunization for 15 minutes without incident. He was provided with Vaccine Information Sheet and instruction to access the V-Safe system.   Mr. Nieblas was instructed to call 911 with any severe reactions post vaccine: Marland Kitchen Difficulty breathing  . Swelling of face and throat  . A fast heartbeat  . A bad rash all over body  . Dizziness and weakness   Immunizations Administered    Name Date Dose VIS Date Route   Pfizer COVID-19 Vaccine 12/08/2019  8:52 AM 0.3 mL 09/01/2019 Intramuscular   Manufacturer: [employer], [employer]   Lot: [id number]   NDC: [address]-9

## 2020-01-02 ENCOUNTER — Ambulatory Visit: Payer: Managed Care, Other (non HMO) | Attending: Internal Medicine

## 2020-01-02 DIAGNOSIS — Z23 Encounter for immunization: Secondary | ICD-10-CM

## 2020-01-02 NOTE — Progress Notes (Signed)
   Covid-19 Vaccination Clinic  Name:  Jeffrey Mann    MRN: [id number] DOB: 1976/06/25  01/02/2020  Mr. Malecha was observed post Covid-19 immunization for 15 minutes without incident. He was provided with Vaccine Information Sheet and instruction to access the V-Safe system.   Mr. Bonawitz was instructed to call 911 with any severe reactions post vaccine: Marland Kitchen Difficulty breathing  . Swelling of face and throat  . A fast heartbeat  . A bad rash all over body  . Dizziness and weakness   Immunizations Administered    Name Date Dose VIS Date Route   Pfizer COVID-19 Vaccine 01/02/2020  9:17 AM 0.3 mL 09/01/2019 Intramuscular   Manufacturer: [employer], [employer]   Lot: [id number]   NDC: [address]-0

## 2020-10-23 ENCOUNTER — Ambulatory Visit: Payer: Managed Care, Other (non HMO) | Admitting: Urology

## 2020-10-23 ENCOUNTER — Ambulatory Visit
Admission: RE | Admit: 2020-10-23 | Discharge: 2020-10-23 | Disposition: A | Discharge status: Home / Self Care | Payer: Managed Care, Other (non HMO) | Attending: Urology | Admitting: Urology

## 2020-10-23 ENCOUNTER — Ambulatory Visit
Admission: RE | Admit: 2020-10-23 | Discharge: 2020-10-23 | Disposition: A | Discharge status: Home / Self Care | Payer: Managed Care, Other (non HMO) | Source: Ambulatory Visit | Attending: Urology | Admitting: Urology

## 2020-10-23 DIAGNOSIS — N200 Calculus of kidney: Secondary | ICD-10-CM

## 2020-11-04 ENCOUNTER — Ambulatory Visit: Payer: Managed Care, Other (non HMO) | Admitting: Urology

## 2020-11-05 ENCOUNTER — Encounter (INDEPENDENT_AMBULATORY_CARE_PROVIDER_SITE_OTHER): Payer: Self-pay

## 2020-11-13 ENCOUNTER — Other Ambulatory Visit: Payer: Self-pay

## 2020-11-13 ENCOUNTER — Ambulatory Visit (INDEPENDENT_AMBULATORY_CARE_PROVIDER_SITE_OTHER): Payer: Managed Care, Other (non HMO) | Admitting: Urology

## 2020-11-13 ENCOUNTER — Encounter: Payer: Self-pay | Admitting: Urology

## 2020-11-13 VITALS — BP 143/87 | HR 68 | Ht 69.0 in | Wt 190.0 lb

## 2020-11-13 DIAGNOSIS — N200 Calculus of kidney: Secondary | ICD-10-CM

## 2020-11-13 NOTE — Progress Notes (Signed)
   11/13/2020 8:44 AM   Jeffrey Mann [id number] [id number]  Reason for visit: Follow up nephrolithiasis  HPI: I saw Jeffrey Mann back in urology clinic for follow-up of nephrolithiasis.  He is a 45 year old male with around 6 spontaneously passed stones over the last 20 years, and has never required surgical intervention.  Last known episode was an 8 mm stone a little over a year ago.  He denies any stone episodes, flank pain, gross hematuria over the last year and has been doing well.  He had a repeat KUB today that I personally reviewed and shows a stable suspected phlebolith in the right pelvis, a ultrasound was performed for further evaluation of this last year and showed no hydronephrosis, and urinalysis had no microscopic hematuria.  We discussed general stone prevention strategies including adequate hydration with goal of producing 2.5 L of urine daily, increasing citric acid intake, increasing calcium intake during high oxalate meals, minimizing animal protein, and decreasing salt intake. Information about dietary recommendations given today.   RTC 1 year with KUB   Sondra Come, MD  [facility] [address] [place], [place] [address] [phone]

## 2020-11-13 NOTE — Patient Instructions (Signed)

## 2021-01-21 IMAGING — US US RENAL
1 series · 14 of 25 positions shown · non-contrast
Comparison: None.

CLINICAL DATA: Nephrolithiasis

EXAM:
RENAL / URINARY TRACT ULTRASOUND COMPLETE

[Series 1: us renal · 14 of 25 slices shown]
[im 1/25]
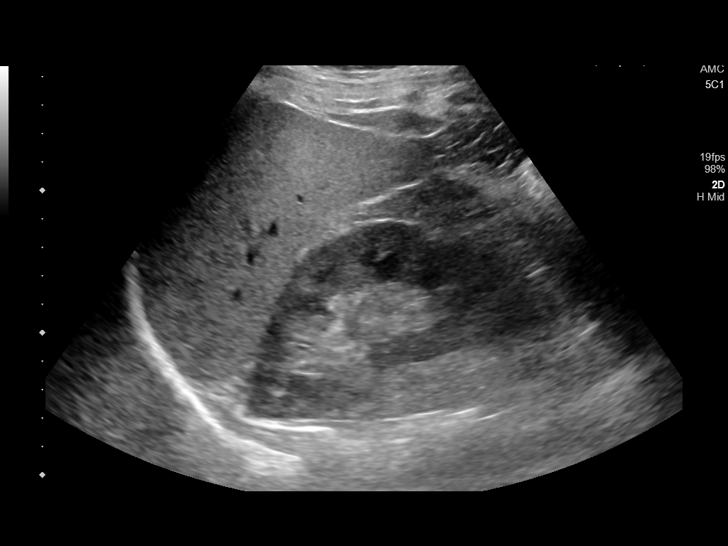
[im 3/25]
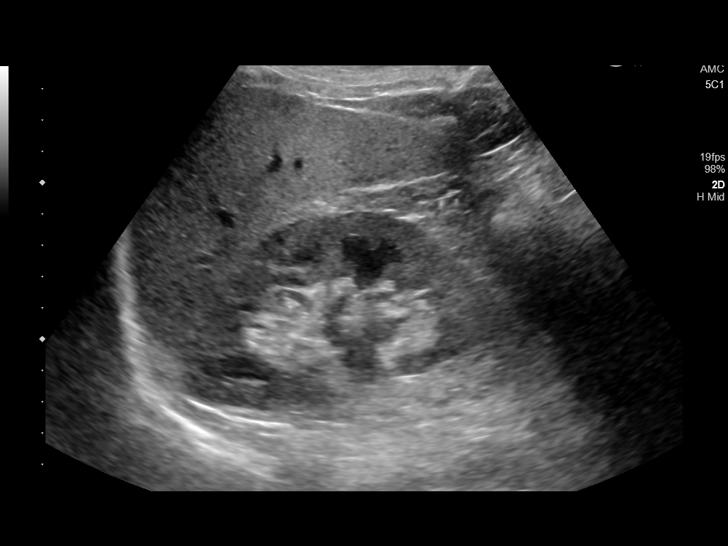
[im 5/25]
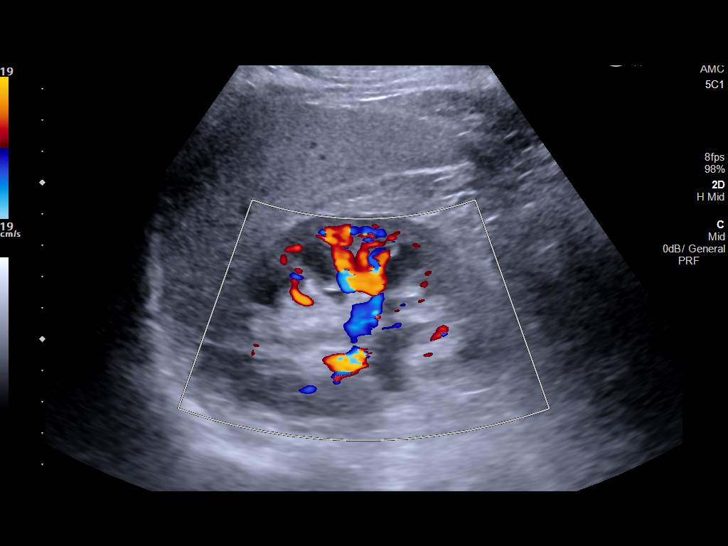
[im 7/25]
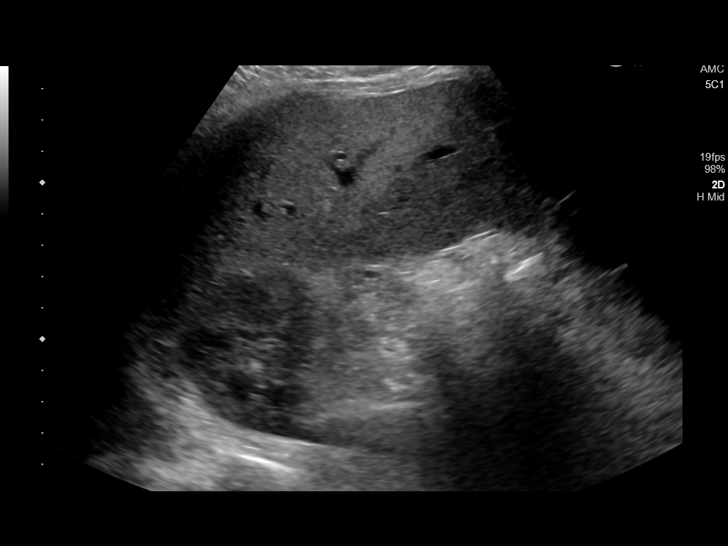
[im 9/25]
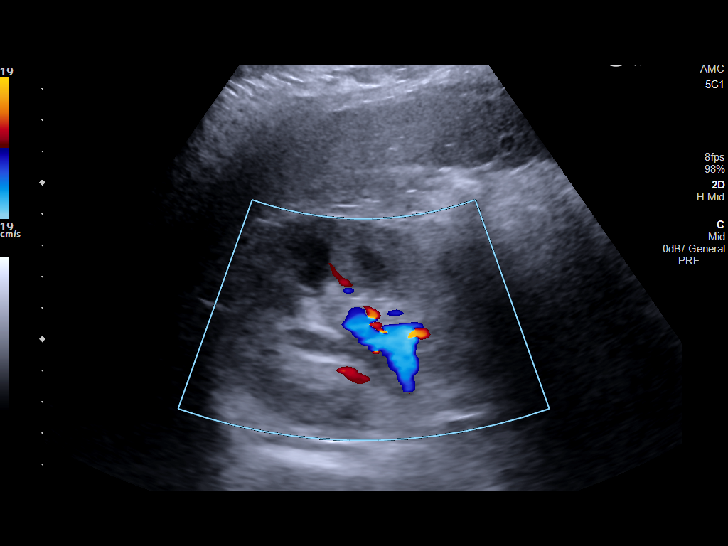
[im 10/25]
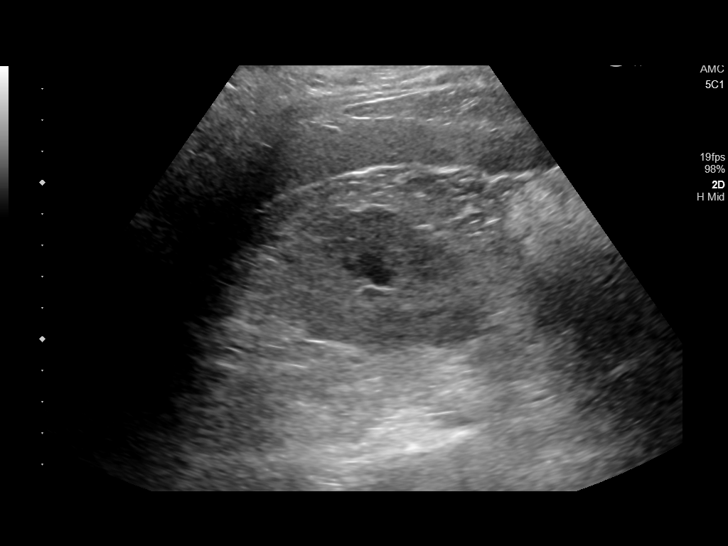
[im 12/25]
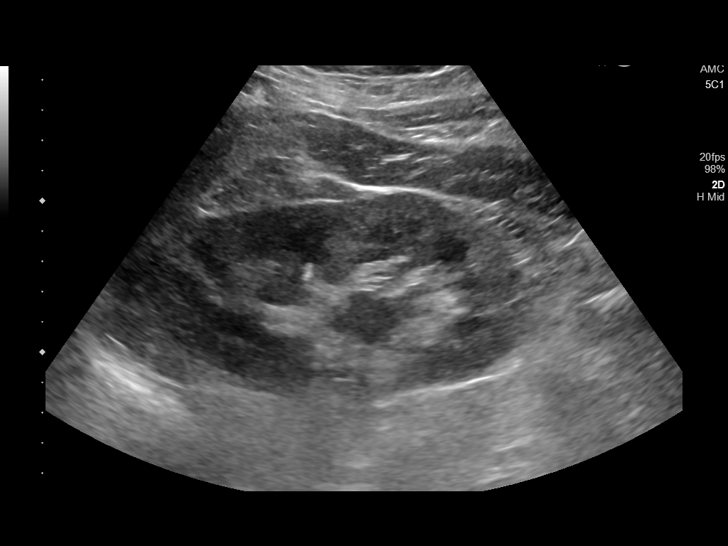
[im 14/25]
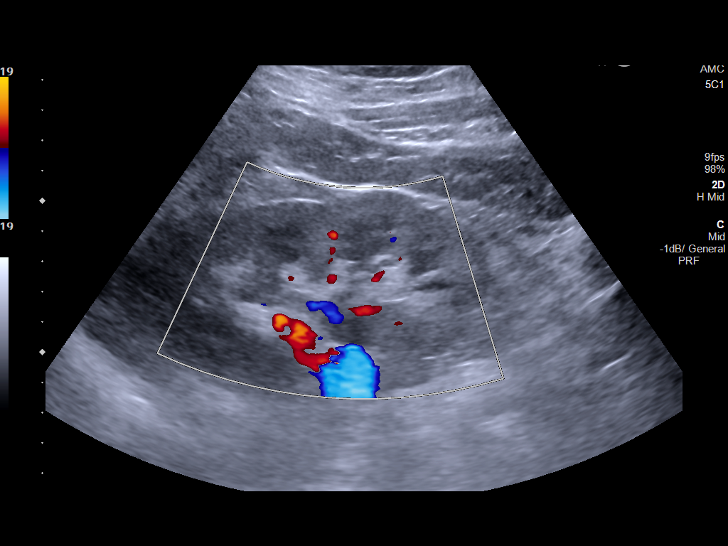
[im 16/25]
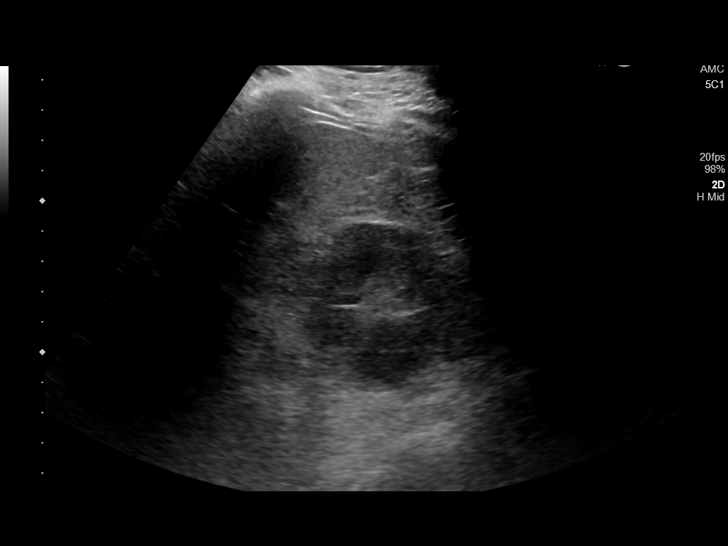
[im 17/25]
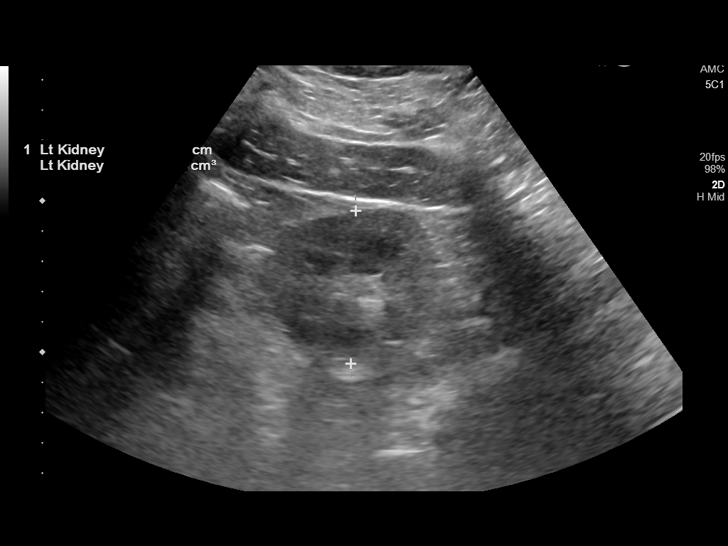
[im 19/25]
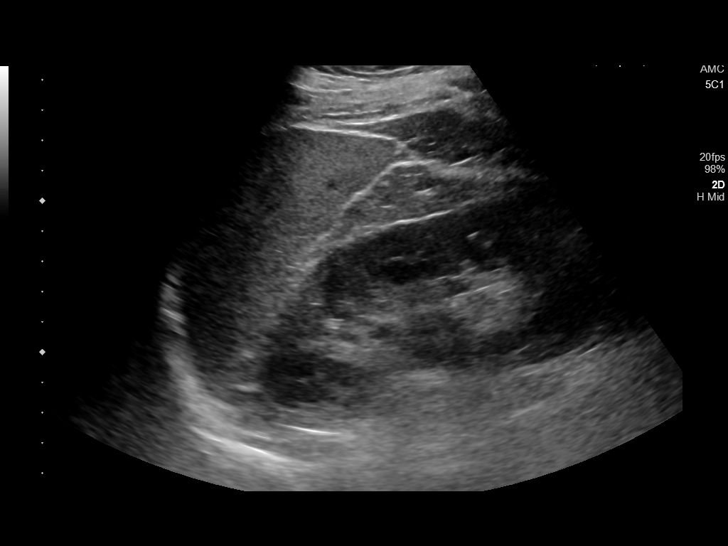
[im 21/25]
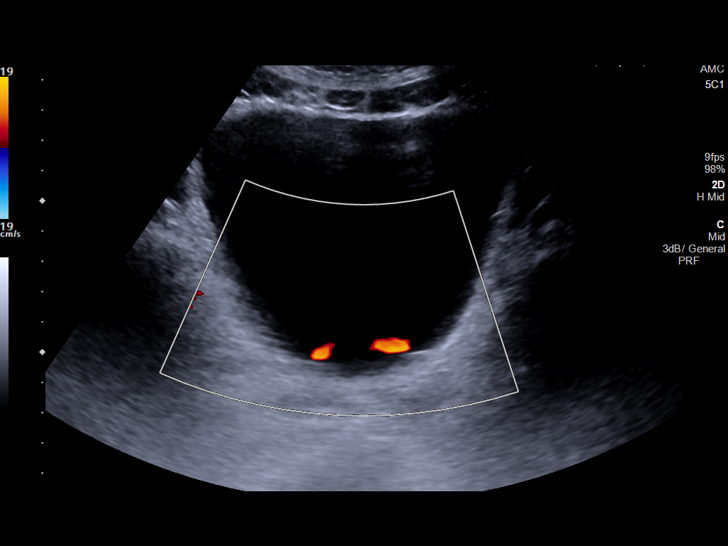
[im 23/25]
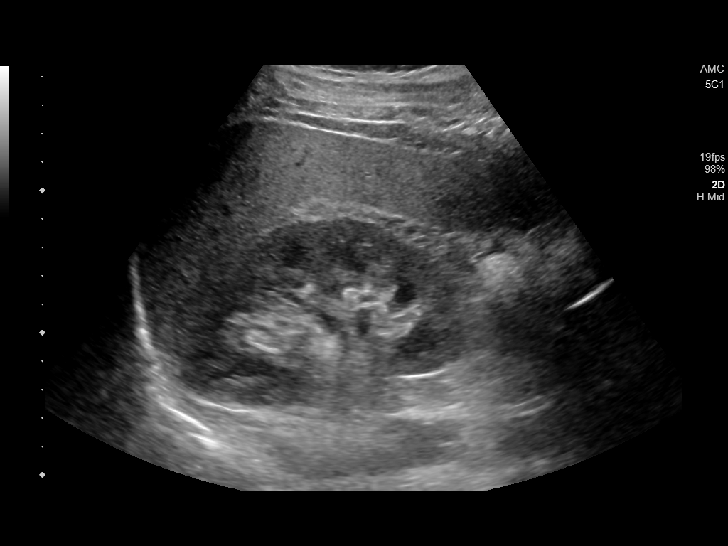
[im 25/25]
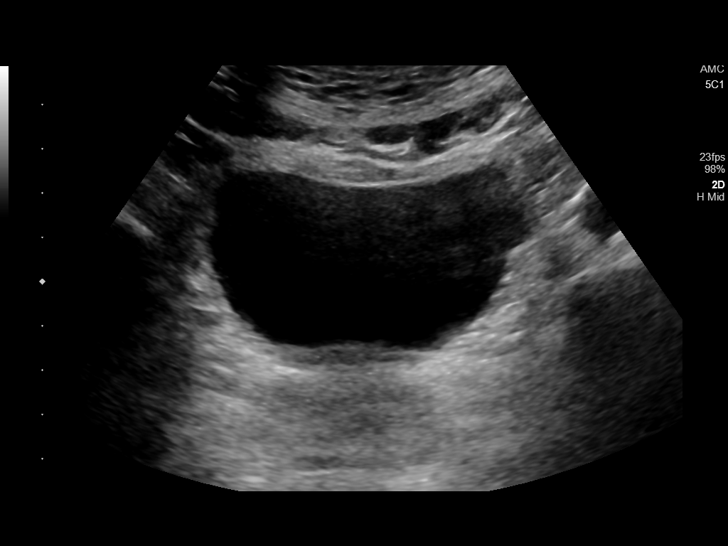

[14 of 25 positions shown; findings below may reference images not displayed]

FINDINGS: Right Kidney:

Renal measurements: 10.7 x 5.9 x 5.9 cm = volume: 197 mL .
Echogenicity within normal limits. No mass or hydronephrosis
visualized.

Left Kidney:

Renal measurements: 12.5 x 6.3 x 5.1 cm = volume: 207 mL.
Echogenicity within normal limits. No mass or hydronephrosis
visualized.

Bladder:

Appears normal for degree of bladder distention. Bilateral renal
pelviectasis resolved following voiding. Both ureteral jets were
visible.

Other:

None.
IMPRESSION: No nephrolithiasis or hydronephrosis.

## 2021-11-12 ENCOUNTER — Ambulatory Visit: Payer: Self-pay | Admitting: Urology

## 2021-12-10 ENCOUNTER — Ambulatory Visit
Admission: RE | Admit: 2021-12-10 | Discharge: 2021-12-10 | Disposition: A | Discharge status: Home / Self Care | Payer: Managed Care, Other (non HMO) | Source: Ambulatory Visit | Attending: Urology | Admitting: Urology

## 2021-12-10 ENCOUNTER — Ambulatory Visit (INDEPENDENT_AMBULATORY_CARE_PROVIDER_SITE_OTHER): Payer: Managed Care, Other (non HMO) | Admitting: Urology

## 2021-12-10 ENCOUNTER — Other Ambulatory Visit: Payer: Self-pay

## 2021-12-10 ENCOUNTER — Other Ambulatory Visit
Admission: RE | Admit: 2021-12-10 | Discharge: 2021-12-10 | Disposition: A | Discharge status: Home / Self Care | Payer: Managed Care, Other (non HMO) | Source: Home / Self Care | Attending: Urology | Admitting: Urology

## 2021-12-10 ENCOUNTER — Encounter: Payer: Self-pay | Admitting: Urology

## 2021-12-10 VITALS — BP 151/100 | HR 90 | Ht 69.0 in | Wt 195.0 lb

## 2021-12-10 DIAGNOSIS — N200 Calculus of kidney: Secondary | ICD-10-CM | POA: Diagnosis present

## 2021-12-10 NOTE — Patient Instructions (Signed)
Dietary Guidelines to Help Prevent Kidney Stones Kidney stones are deposits of minerals and salts that form inside your kidneys. Your risk of developing kidney stones may be greater depending on your diet, your lifestyle, the medicines you take, and whether you have certain medical conditions. Most people can lower their chances of developing kidney stones by following the instructions below. Your dietitian may give you more specific instructions depending on your overall health and the type of kidney stones you tend to develop. What are tips for following this plan? Reading food labels  Choose foods with "no salt added" or "low-salt" labels. Limit your salt (sodium) intake to less than 1,500 mg a day. Choose foods with calcium for each meal and snack. Try to eat about 300 mg of calcium at each meal. Foods that contain 200-500 mg of calcium a serving include: 8 oz (237 mL) of milk, calcium-fortifiednon-dairy milk, and calcium-fortifiedfruit juice. Calcium-fortified means that calcium has been added to these drinks. 8 oz (237 mL) of kefir, yogurt, and soy yogurt. 4 oz (114 g) of tofu. 1 oz (28 g) of cheese. 1 cup (150 g) of dried figs. 1 cup (91 g) of cooked broccoli. One 3 oz (85 g) can of sardines or mackerel. Most people need 1,000-1,500 mg of calcium a day. Talk to your dietitian about how much calcium is recommended for you. Shopping Buy plenty of fresh fruits and vegetables. Most people do not need to avoid fruits and vegetables, even if these foods contain nutrients that may contribute to kidney stones. When shopping for convenience foods, choose: Whole pieces of fruit. Pre-made salads with dressing on the side. Low-fat fruit and yogurt smoothies. Avoid buying frozen meals or prepared deli foods. These can be high in sodium. Look for foods with live cultures, such as yogurt and kefir. Choose high-fiber grains, such as whole-wheat breads, oat bran, and wheat cereals. Cooking Do not add  salt to food when cooking. Place a salt shaker on the table and allow each person to add his or her own salt to taste. Use vegetable protein, such as beans, textured vegetable protein (TVP), or tofu, instead of meat in pasta, casseroles, and soups. Meal planning Eat less salt, if told by your dietitian. To do this: Avoid eating processed or pre-made food. Avoid eating fast food. Eat less animal protein, including cheese, meat, poultry, or fish, if told by your dietitian. To do this: Limit the number of times you have meat, poultry, fish, or cheese each week. Eat a diet free of meat at least 2 days a week. Eat only one serving each day of meat, poultry, fish, or seafood. When you prepare animal protein, cut pieces into small portion sizes. For most meat and fish, one serving is about the size of the palm of your hand. Eat at least five servings of fresh fruits and vegetables each day. To do this: Keep fruits and vegetables on hand for snacks. Eat one piece of fruit or a handful of berries with breakfast. Have a salad and fruit at lunch. Have two kinds of vegetables at dinner. Limit foods that are high in a substance called oxalate. These include: Spinach (cooked), rhubarb, beets, sweet potatoes, and Swiss chard. Peanuts. Potato chips, french fries, and baked potatoes with skin on. Nuts and nut products. Chocolate. If you regularly take a diuretic medicine, make sure to eat at least 1 or 2 servings of fruits or vegetables that are high in potassium each day. These include: Avocado. Banana. Orange, prune,   carrot, or tomato juice. Baked potato. Cabbage. Beans and split peas. Lifestyle  Drink enough fluid to keep your urine pale yellow. This is the most important thing you can do. Spread your fluid intake throughout the day. If you drink alcohol: Limit how much you use to: 0-1 drink a day for women who are not pregnant. 0-2 drinks a day for men. Be aware of how much alcohol is in your  drink. In the [place] one drink equals one 12 oz bottle of beer (355 mL), one 5 oz glass of wine (148 mL), or one 1 oz glass of hard liquor (44 mL). Lose weight if told by your health care provider. Work with your dietitian to find an eating plan and weight loss strategies that work best for you. General information Talk to your health care provider and dietitian about taking daily supplements. You may be told the following depending on your health and the cause of your kidney stones: Not to take supplements with vitamin C. To take a calcium supplement. To take a daily probiotic supplement. To take other supplements such as magnesium, fish oil, or vitamin B6. Take over-the-counter and prescription medicines only as told by your health care provider. These include supplements. What foods should I limit? Limit your intake of the following foods, or eat them as told by your dietitian. Vegetables Spinach. Rhubarb. Beets. Canned vegetables. Pickles. Olives. Baked potatoes with skin. Grains Wheat bran. Baked goods. Salted crackers. Cereals high in sugar. Meats and other proteins Nuts. Nut butters. Large portions of meat, poultry, or fish. Salted, precooked, or cured meats, such as sausages, meat loaves, and hot dogs. Dairy Cheese. Beverages Regular soft drinks. Regular vegetable juice. Seasonings and condiments Seasoning blends with salt. Salad dressings. Soy sauce. Ketchup. Barbecue sauce. Other foods Canned soups. Canned pasta sauce. Casseroles. Pizza. Lasagna. Frozen meals. Potato chips. French fries. The items listed above may not be a complete list of foods and beverages you should limit. Contact a dietitian for more information. What foods should I avoid? Talk to your dietitian about specific foods you should avoid based on the type of kidney stones you have and your overall health. Fruits Grapefruit. The item listed above may not be a complete list of foods and beverages you should  avoid. Contact a dietitian for more information. Summary Kidney stones are deposits of minerals and salts that form inside your kidneys. You can lower your risk of kidney stones by making changes to your diet. The most important thing you can do is drink enough fluid. Drink enough fluid to keep your urine pale yellow. Talk to your dietitian about how much calcium you should have each day, and eat less salt and animal protein as told by your dietitian. This information is not intended to replace advice given to you by your health care provider. Make sure you discuss any questions you have with your health care provider. Document Revised: 08/31/2019 Document Reviewed: 08/31/2019 Elsevier Patient Education  2022 [employer]  

## 2021-12-10 NOTE — Progress Notes (Signed)
? ?  12/10/2021 ?9:49 AM  ? ?Jeffrey Mann ?01/07/1976 ?[id number] ? ?Reason for visit: Follow up nephrolithiasis ? ?HPI: ?I saw [name] back in urology clinic for follow-up of nephrolithiasis.  He is a 46 year old male with around 6 spontaneously passed stones over the last 20 years, and has never required surgical intervention.  Last known episode was an 8 mm stone in February 2021.  He denies any stone episodes, flank pain, gross hematuria over the last year and has been doing well.  He had a repeat KUB today that I personally reviewed and shows a stable suspected phlebolith in the right pelvis, possible 1 mm right lower pole stone, but no other evidence of nephrolithiasis. ? ?We discussed general stone prevention strategies including adequate hydration with goal of producing 2.5 L of urine daily, increasing citric acid intake, increasing calcium intake during high oxalate meals, minimizing animal protein, and decreasing salt intake. Information about dietary recommendations given today.  ? ?He prefers to follow-up as needed, return precautions discussed ? ? ?Billey Co, MD ? ?[facility] ?[address] ?[place], [place] [address] ?[phone] ? ? ?

## 2022-01-04 IMAGING — CR DG ABDOMEN 1V
2 series · 2 of 2 positions shown · non-contrast
Comparison: 11/10/2019 renal ultrasound.  Plain film 10/25/2019.

CLINICAL DATA: Follow-up of kidney stones.

EXAM:
ABDOMEN - 1 VIEW

[abdomen kub (1 of 2)]
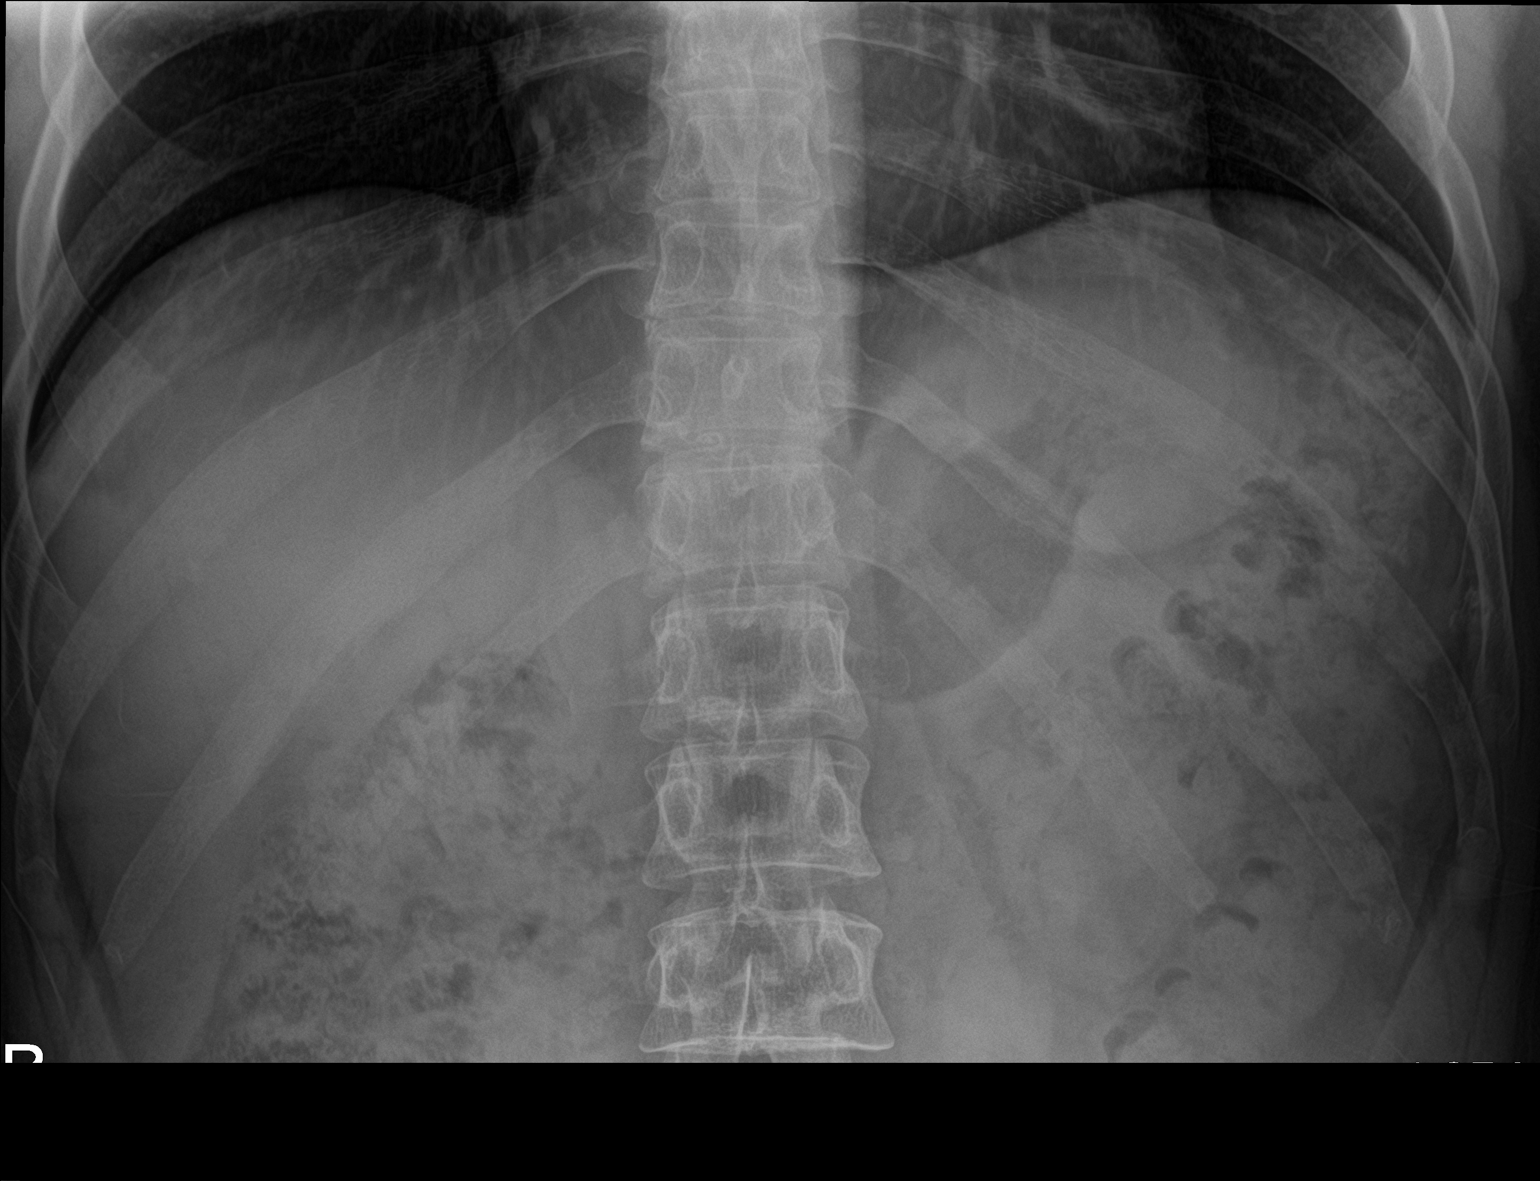

[abdomen kub (2 of 2)]
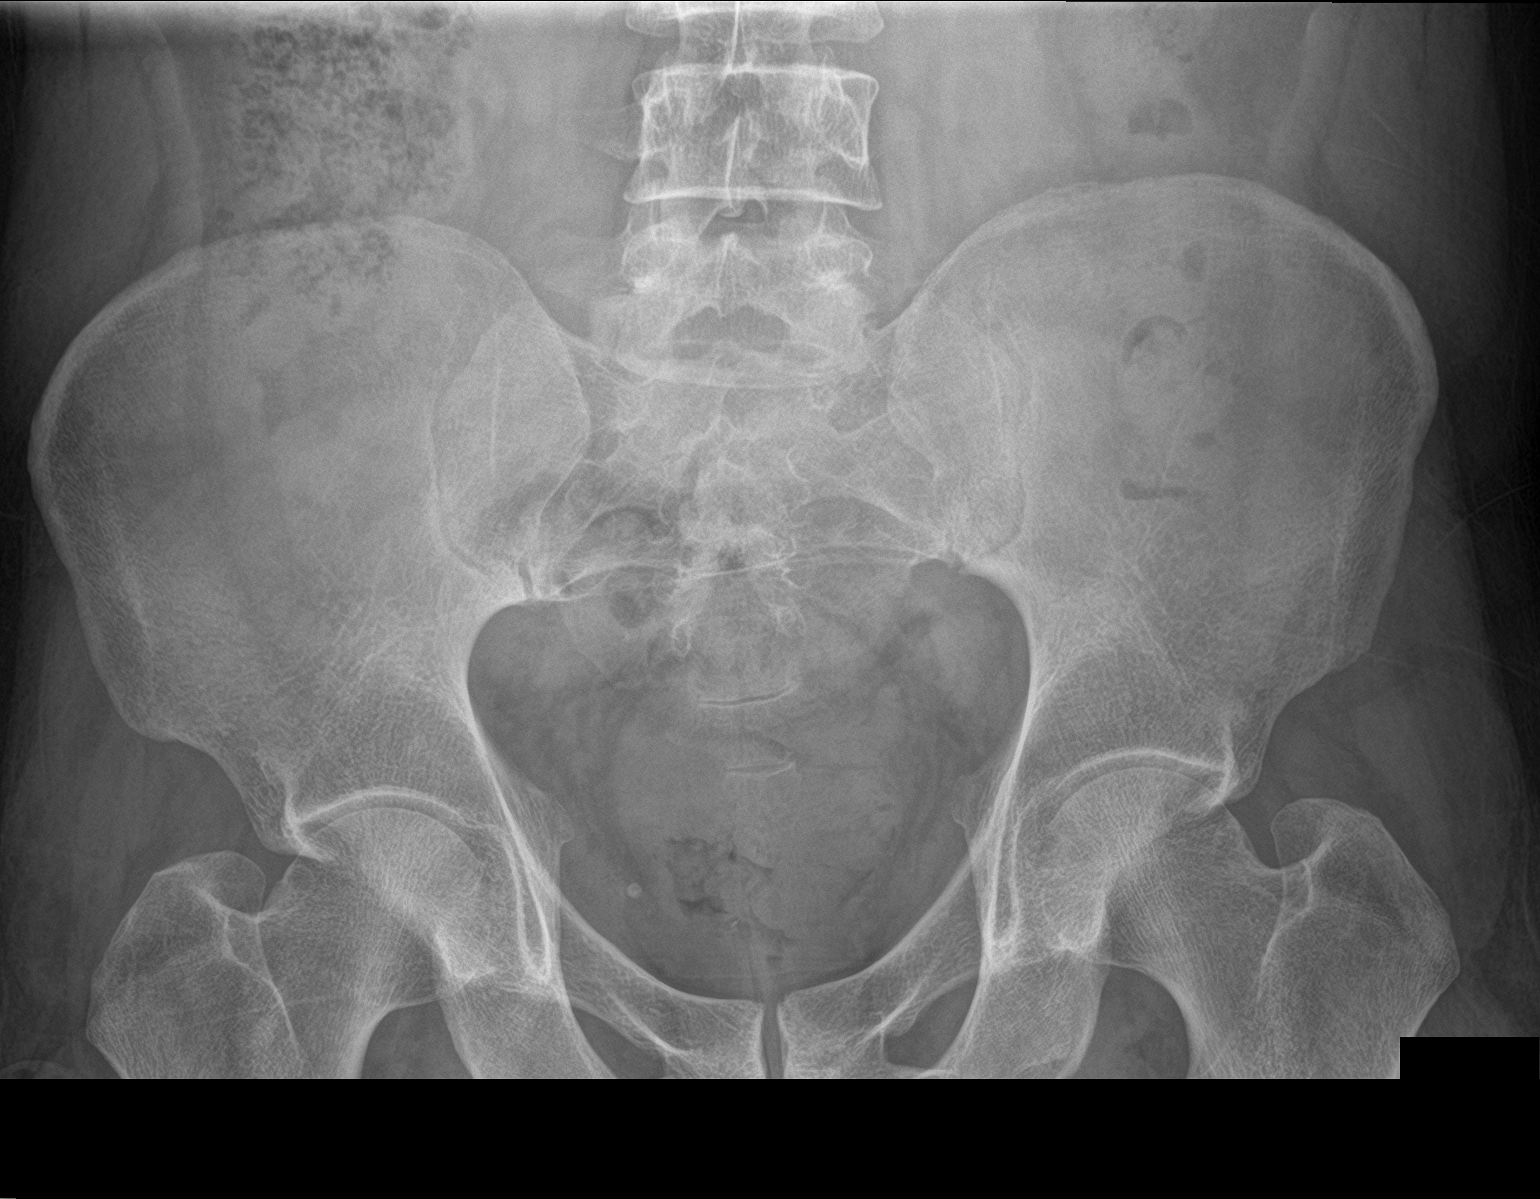

[2 of 2 positions shown; findings below may reference images not displayed]

FINDINGS: Two supine views. Large colonic stool burden. No calcific densities
over the kidneys or upper ureters.

Similar 3 mm calcification projecting over the right hemipelvis.
IMPRESSION: Similar 3 mm right pelvic calcification which could be vascular or
represent a stone within the distal right ureter.

## 2023-02-21 IMAGING — CR DG ABDOMEN 1V
1 series · 2 of 2 positions shown · non-contrast
Comparison: Similar study 10/23/2020

CLINICAL DATA: Previous history of kidney stones on the right. No
current complaints. History of nephrolithiasis. N 20.0.

EXAM:
ABDOMEN - 1 VIEW

[Series 1: dg abd 1 view · 0.14mm/px · 2 of 2 slices shown]
[im 1/2]
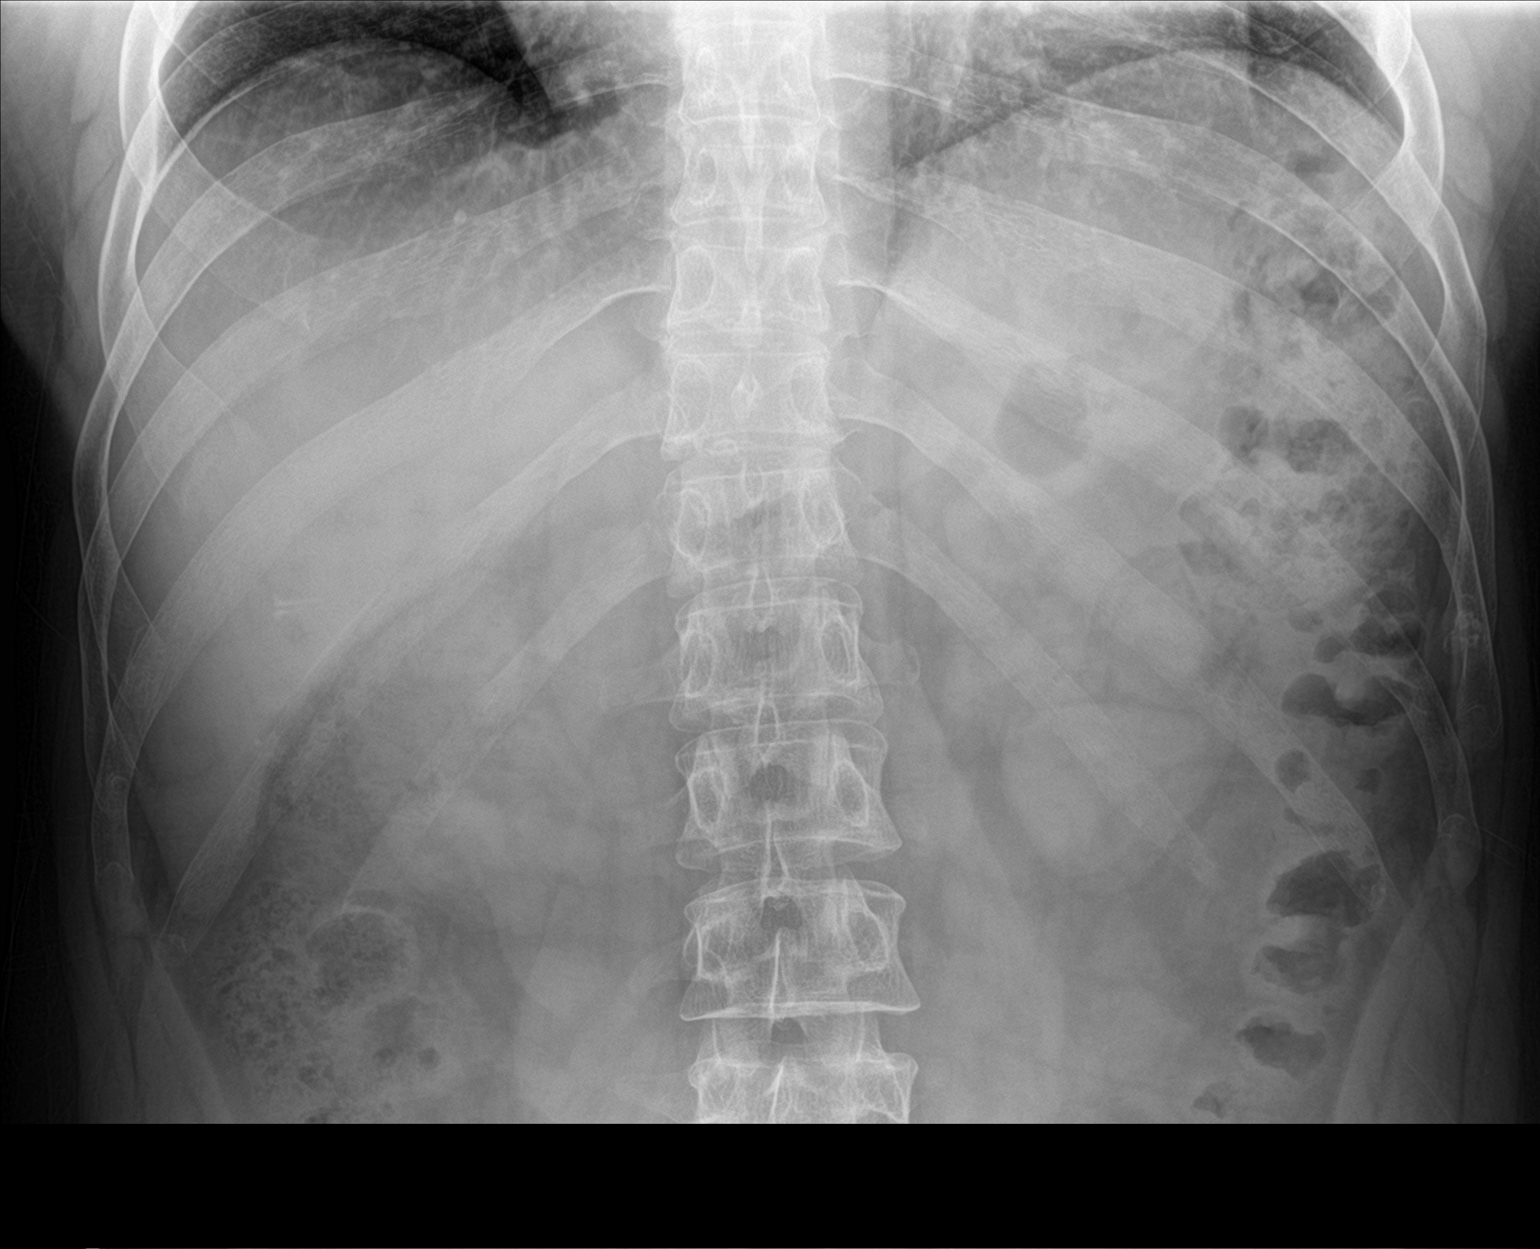
[im 2/2]
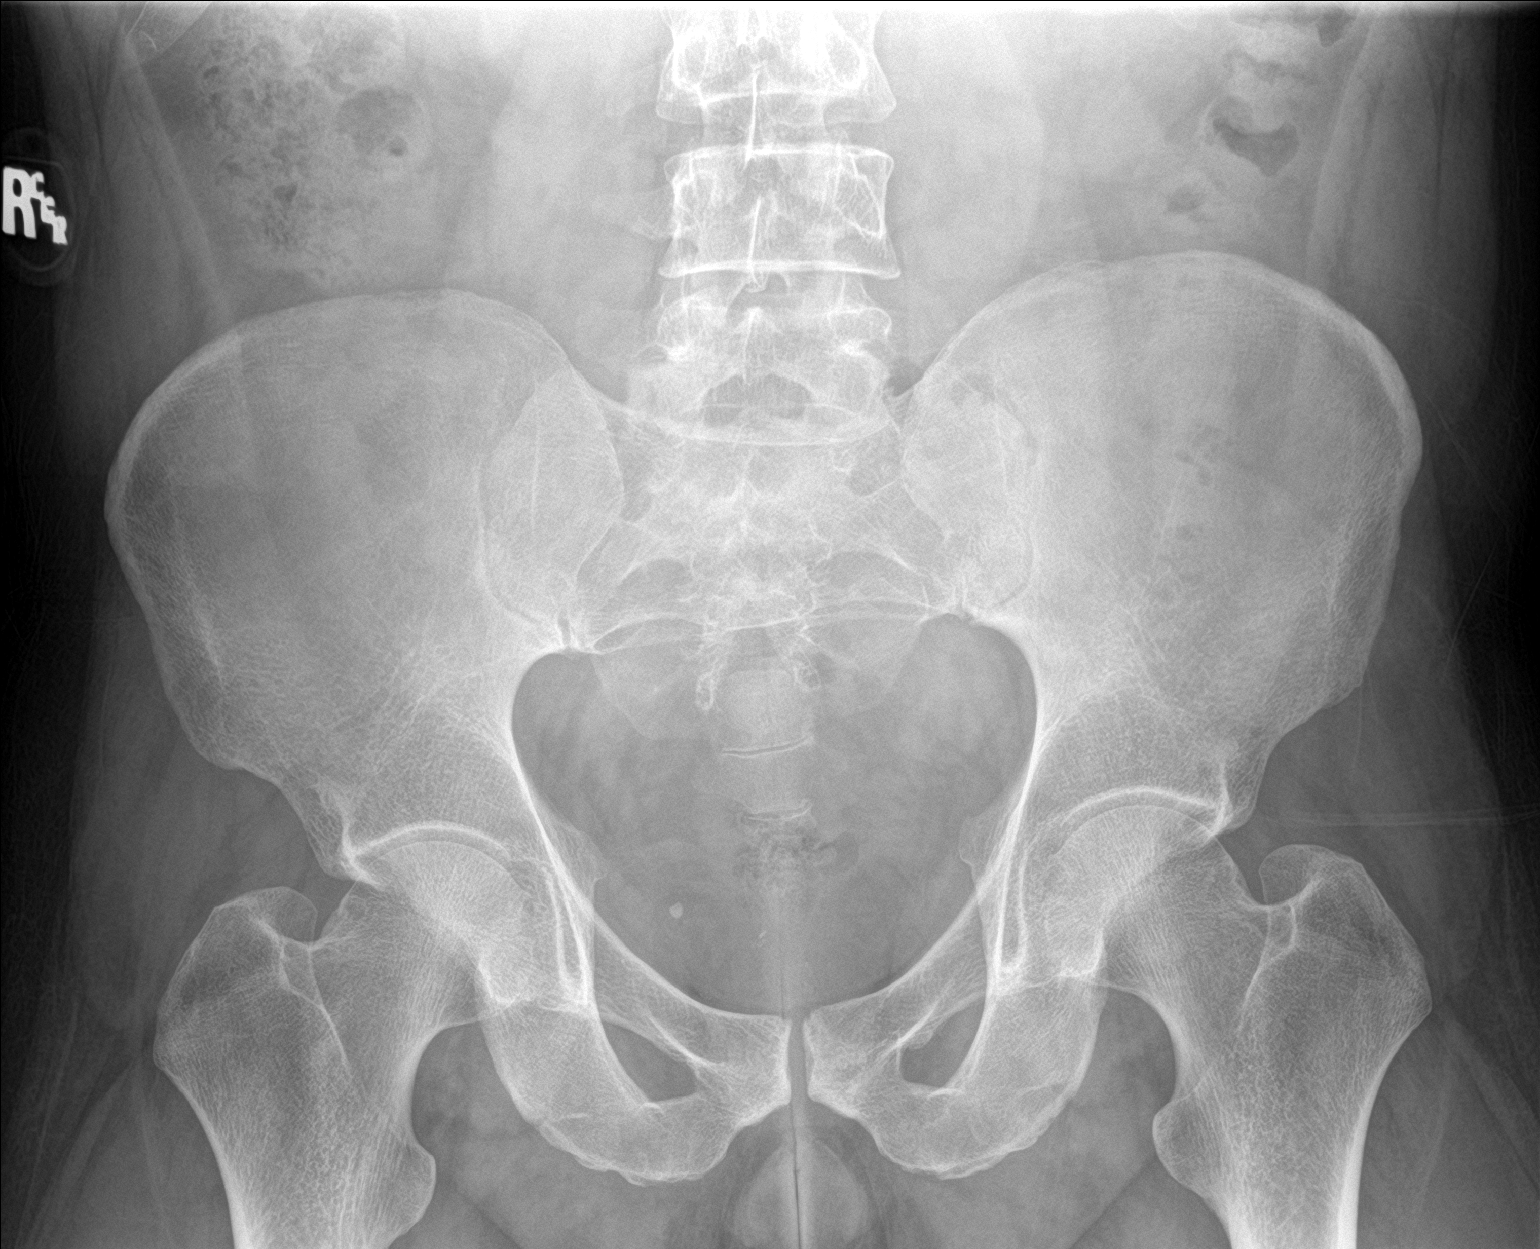

[2 of 2 positions shown; findings below may reference images not displayed]

FINDINGS: The bowel gas pattern is normal. Visceral shadows are stable. There
is no supine evidence of free air.

No intrarenal stones are visible radiographically. Along the plane
of the ureters, no stone is seen except for a single right pelvic 4
mm stone which was seen on the prior study and is unchanged in
position.

This would either be a phlebolith or a chronic UVJ stone as it has
not changed in position.

There is no prior CT to confirm whether this is at the UVJ or in the
pelvis. There are no further pelvic pathologic calcifications.
IMPRESSION: Solitary 4 mm calculus in the right hemipelvis, unchanged in
position, size and appearance and could be a pelvic phlebolith or a
chronic right UVJ stone. We have no prior CT for comparison. This
was also present on a KUB from 10/25/2019.
